# Patient Record
Sex: Female | Born: 1980 | Race: White | Hispanic: No | Marital: Married | Smoking: Former smoker
Health system: Southern US, Community
[De-identification: ages and names within clinical notes are randomized; demographics above are authoritative.]

## PROBLEM LIST (undated history)

## (undated) ENCOUNTER — Inpatient Hospital Stay (HOSPITAL_COMMUNITY): Payer: Self-pay

## (undated) DIAGNOSIS — Z8632 Personal history of gestational diabetes: Secondary | ICD-10-CM

## (undated) DIAGNOSIS — O1490 Unspecified pre-eclampsia, unspecified trimester: Secondary | ICD-10-CM

## (undated) DIAGNOSIS — R5600 Simple febrile convulsions: Secondary | ICD-10-CM

## (undated) DIAGNOSIS — E039 Hypothyroidism, unspecified: Secondary | ICD-10-CM

## (undated) DIAGNOSIS — O24419 Gestational diabetes mellitus in pregnancy, unspecified control: Secondary | ICD-10-CM

## (undated) DIAGNOSIS — R894 Abnormal immunological findings in specimens from other organs, systems and tissues: Secondary | ICD-10-CM

## (undated) DIAGNOSIS — O139 Gestational [pregnancy-induced] hypertension without significant proteinuria, unspecified trimester: Secondary | ICD-10-CM

## (undated) DIAGNOSIS — N920 Excessive and frequent menstruation with regular cycle: Secondary | ICD-10-CM

## (undated) DIAGNOSIS — G4730 Sleep apnea, unspecified: Secondary | ICD-10-CM

## (undated) DIAGNOSIS — A749 Chlamydial infection, unspecified: Secondary | ICD-10-CM

## (undated) DIAGNOSIS — I10 Essential (primary) hypertension: Secondary | ICD-10-CM

## (undated) DIAGNOSIS — A6000 Herpesviral infection of urogenital system, unspecified: Secondary | ICD-10-CM

## (undated) HISTORY — PX: TYMPANOSTOMY TUBE PLACEMENT: SHX32

## (undated) HISTORY — PX: BREAST SURGERY: SHX581

## (undated) HISTORY — PX: BREAST REDUCTION SURGERY: SHX8

---
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
## 2010-01-18 ENCOUNTER — Emergency Department (HOSPITAL_COMMUNITY): Admission: EM | Admit: 2010-01-18 | Discharge: 2010-01-18 | Payer: Self-pay | Admitting: Emergency Medicine

## 2010-02-07 ENCOUNTER — Emergency Department (HOSPITAL_COMMUNITY)
Admission: EM | Admit: 2010-02-07 | Discharge: 2010-02-07 | Payer: Self-pay | Source: Home / Self Care | Admitting: Emergency Medicine

## 2010-02-27 LAB — RPR: RPR: NONREACTIVE

## 2010-02-27 LAB — HEPATITIS B SURFACE ANTIGEN: Hepatitis B Surface Ag: NEGATIVE

## 2010-03-02 DIAGNOSIS — O139 Gestational [pregnancy-induced] hypertension without significant proteinuria, unspecified trimester: Secondary | ICD-10-CM

## 2010-03-02 HISTORY — DX: Gestational (pregnancy-induced) hypertension without significant proteinuria, unspecified trimester: O13.9

## 2010-09-06 ENCOUNTER — Inpatient Hospital Stay (HOSPITAL_COMMUNITY)
Admission: AD | Admit: 2010-09-06 | Discharge: 2010-09-07 | Disposition: A | Discharge status: Home / Self Care | Payer: BC Managed Care – PPO | Source: Ambulatory Visit | Attending: Obstetrics and Gynecology | Admitting: Obstetrics and Gynecology

## 2010-09-06 ENCOUNTER — Encounter (HOSPITAL_COMMUNITY): Payer: Self-pay | Admitting: *Deleted

## 2010-09-06 ENCOUNTER — Inpatient Hospital Stay (HOSPITAL_COMMUNITY)
Admission: AD | Admit: 2010-09-06 | Payer: BC Managed Care – PPO | Source: Ambulatory Visit | Admitting: Obstetrics and Gynecology

## 2010-09-06 DIAGNOSIS — O4700 False labor before 37 completed weeks of gestation, unspecified trimester: Secondary | ICD-10-CM | POA: Insufficient documentation

## 2010-09-06 HISTORY — DX: Herpesviral infection of urogenital system, unspecified: A60.00

## 2010-09-06 HISTORY — DX: Chlamydial infection, unspecified: A74.9

## 2010-09-06 HISTORY — DX: Hypothyroidism, unspecified: E03.9

## 2010-09-06 LAB — URINALYSIS, ROUTINE W REFLEX MICROSCOPIC
Leukocytes, UA: NEGATIVE
Protein, ur: >300 mg/dL — AB
Urobilinogen, UA: 0.2 mg/dL (ref 0.0–1.0)

## 2010-09-06 LAB — URINE MICROSCOPIC-ADD ON

## 2010-09-06 NOTE — Initial Assessments (Signed)
Patient is here with c/o contraction every 2-3mins since 2am. She denies any vaginal bleeding or lof. She does c/o yellow, thick non-odorous vaginal discharge. She report good fetal movement.

## 2010-09-06 NOTE — Progress Notes (Signed)
Pt states, " I've had contractions all day and they are 2-3 min apart."

## 2010-09-07 LAB — CBC
HCT: 35.4 % — ABNORMAL LOW (ref 36.0–46.0)
MCV: 84.9 fL (ref 78.0–100.0)
RDW: 14.8 % (ref 11.5–15.5)
WBC: 14.2 10*3/uL — ABNORMAL HIGH (ref 4.0–10.5)

## 2010-09-07 LAB — COMPREHENSIVE METABOLIC PANEL
Albumin: 2.3 g/dL — ABNORMAL LOW (ref 3.5–5.2)
BUN: 12 mg/dL (ref 6–23)
Chloride: 110 mEq/L (ref 96–112)
Creatinine, Ser: 0.65 mg/dL (ref 0.50–1.10)
GFR calc Af Amer: >60 mL/min (ref >60)
GFR calc non Af Amer: >60 mL/min (ref >60)
Glucose, Bld: 87 mg/dL (ref 70–99)
Total Bilirubin: 0.2 mg/dL — ABNORMAL LOW (ref 0.3–1.2)

## 2010-09-07 LAB — URIC ACID: Uric Acid, Serum: 7.6 mg/dL — ABNORMAL HIGH (ref 2.4–7.0)

## 2010-09-07 LAB — LACTATE DEHYDROGENASE: LDH: 251 U/L — ABNORMAL HIGH (ref 94–250)

## 2010-09-07 NOTE — Plan of Care (Signed)
Dr Senaida Ores returned call and she is notified of bp reading, lab results. Order to proceed with discharge plan. Have patient obtain and return the 24 hour urine.

## 2010-09-07 NOTE — Plan of Care (Signed)
Dr Senaida Ores returned page. She is notifed of patient bp reading, ua results. Order to obtain cbc, cmet, uric acid, ldh. She states that is labs are normal. Discharge patient home with 24 hour urine collection for her to return to the office on Monday. Give labor precautions, pregnancy-induced hypertension instructions.

## 2010-09-08 ENCOUNTER — Encounter (HOSPITAL_COMMUNITY): Payer: Self-pay | Admitting: Obstetrics and Gynecology

## 2010-09-08 ENCOUNTER — Encounter: Payer: Self-pay | Admitting: Obstetrics and Gynecology

## 2010-09-08 ENCOUNTER — Other Ambulatory Visit: Payer: Self-pay | Admitting: Obstetrics and Gynecology

## 2010-09-08 ENCOUNTER — Encounter (HOSPITAL_COMMUNITY): Payer: Self-pay | Admitting: *Deleted

## 2010-09-08 ENCOUNTER — Inpatient Hospital Stay (HOSPITAL_COMMUNITY)
Admission: AD | Admit: 2010-09-08 | Discharge: 2010-09-15 | DRG: 372 | Disposition: A | Discharge status: Home / Self Care | Payer: BC Managed Care – PPO | Source: Ambulatory Visit | Attending: Obstetrics and Gynecology | Admitting: Obstetrics and Gynecology

## 2010-09-08 DIAGNOSIS — O99892 Other specified diseases and conditions complicating childbirth: Secondary | ICD-10-CM | POA: Diagnosis present

## 2010-09-08 DIAGNOSIS — O1400 Mild to moderate pre-eclampsia, unspecified trimester: Secondary | ICD-10-CM | POA: Insufficient documentation

## 2010-09-08 DIAGNOSIS — E079 Disorder of thyroid, unspecified: Secondary | ICD-10-CM | POA: Diagnosis present

## 2010-09-08 DIAGNOSIS — O403XX0 Polyhydramnios, third trimester, not applicable or unspecified: Secondary | ICD-10-CM | POA: Insufficient documentation

## 2010-09-08 DIAGNOSIS — Z3490 Encounter for supervision of normal pregnancy, unspecified, unspecified trimester: Secondary | ICD-10-CM

## 2010-09-08 DIAGNOSIS — Z22330 Carrier of Group B streptococcus: Secondary | ICD-10-CM

## 2010-09-08 DIAGNOSIS — E039 Hypothyroidism, unspecified: Secondary | ICD-10-CM | POA: Diagnosis present

## 2010-09-08 DIAGNOSIS — O99284 Endocrine, nutritional and metabolic diseases complicating childbirth: Secondary | ICD-10-CM | POA: Diagnosis present

## 2010-09-08 DIAGNOSIS — IMO0002 Reserved for concepts with insufficient information to code with codable children: Principal | ICD-10-CM | POA: Diagnosis present

## 2010-09-08 DIAGNOSIS — O409XX0 Polyhydramnios, unspecified trimester, not applicable or unspecified: Secondary | ICD-10-CM | POA: Diagnosis present

## 2010-09-08 HISTORY — DX: Gestational (pregnancy-induced) hypertension without significant proteinuria, unspecified trimester: O13.9

## 2010-09-08 LAB — COMPREHENSIVE METABOLIC PANEL
ALT: 18 U/L (ref 0–35)
Alkaline Phosphatase: 105 U/L (ref 39–117)
Chloride: 104 mEq/L (ref 96–112)
GFR calc Af Amer: >60 mL/min (ref >60)
Glucose, Bld: 128 mg/dL — ABNORMAL HIGH (ref 70–99)
Potassium: 3.7 mEq/L (ref 3.5–5.1)
Sodium: 137 mEq/L (ref 135–145)
Total Bilirubin: 0.1 mg/dL — ABNORMAL LOW (ref 0.3–1.2)
Total Protein: 6.1 g/dL (ref 6.0–8.3)

## 2010-09-08 LAB — CBC
Hemoglobin: 11.8 g/dL — ABNORMAL LOW (ref 12.0–15.0)
MCHC: 33.4 g/dL (ref 30.0–36.0)
RBC: 4.14 MIL/uL (ref 3.87–5.11)
WBC: 13.6 10*3/uL — ABNORMAL HIGH (ref 4.0–10.5)

## 2010-09-08 MED ORDER — ZOLPIDEM TARTRATE 10 MG PO TABS
10.0000 mg | ORAL_TABLET | Freq: Every evening | ORAL | Status: DC | PRN
  Administered 2010-09-11: 10 mg via ORAL
  Filled 2010-09-08: qty 1

## 2010-09-08 MED ORDER — DIPHENHYDRAMINE HCL 25 MG PO CAPS
25.0000 mg | ORAL_CAPSULE | Freq: Four times a day (QID) | ORAL | Status: DC | PRN
  Administered 2010-09-08: 25 mg via ORAL
  Filled 2010-09-08: qty 1

## 2010-09-08 MED ORDER — FLUTICASONE PROPIONATE HFA 44 MCG/ACT IN AERO
1.0000 | INHALATION_SPRAY | Freq: Every day | RESPIRATORY_TRACT | Status: DC
  Administered 2010-09-09 – 2010-09-11 (×3): 1 via RESPIRATORY_TRACT
  Filled 2010-09-08 (×5): qty 1

## 2010-09-08 MED ORDER — LEVOTHYROXINE SODIUM 50 MCG PO TABS
50.0000 ug | ORAL_TABLET | Freq: Every day | ORAL | Status: DC
  Administered 2010-09-09 – 2010-09-12 (×4): 50 ug via ORAL
  Filled 2010-09-08 (×5): qty 1

## 2010-09-08 MED ORDER — LORATADINE 10 MG PO TABS
10.0000 mg | ORAL_TABLET | Freq: Every day | ORAL | Status: DC
  Administered 2010-09-09 – 2010-09-11 (×3): 10 mg via ORAL
  Filled 2010-09-08 (×5): qty 1

## 2010-09-08 MED ORDER — ALBUTEROL SULFATE HFA 108 (90 BASE) MCG/ACT IN AERS
2.0000 | INHALATION_SPRAY | Freq: Four times a day (QID) | RESPIRATORY_TRACT | Status: DC | PRN
  Filled 2010-09-08: qty 6.7

## 2010-09-08 MED ORDER — VALACYCLOVIR HCL 500 MG PO TABS
500.0000 mg | ORAL_TABLET | Freq: Every day | ORAL | Status: DC
  Filled 2010-09-08: qty 1

## 2010-09-08 MED ORDER — PRENATAL PLUS 27-1 MG PO TABS
1.0000 | ORAL_TABLET | Freq: Every day | ORAL | Status: DC
  Administered 2010-09-09 – 2010-09-11 (×3): 1 via ORAL
  Filled 2010-09-08 (×3): qty 1

## 2010-09-08 MED ORDER — DOCUSATE SODIUM 100 MG PO CAPS
100.0000 mg | ORAL_CAPSULE | Freq: Every day | ORAL | Status: DC
  Administered 2010-09-11: 100 mg via ORAL
  Filled 2010-09-08: qty 1

## 2010-09-08 MED ORDER — FLUTICASONE PROPIONATE HFA 44 MCG/ACT IN AERO
1.0000 | INHALATION_SPRAY | Freq: Two times a day (BID) | RESPIRATORY_TRACT | Status: DC
  Filled 2010-09-08: qty 1

## 2010-09-08 MED ORDER — CALCIUM CARBONATE ANTACID 500 MG PO CHEW
2.0000 | CHEWABLE_TABLET | ORAL | Status: DC | PRN
  Administered 2010-09-09: 400 mg via ORAL
  Filled 2010-09-08: qty 2

## 2010-09-08 MED ORDER — ACETAMINOPHEN 325 MG PO TABS
650.0000 mg | ORAL_TABLET | ORAL | Status: DC | PRN
  Administered 2010-09-09 – 2010-09-11 (×3): 650 mg via ORAL
  Filled 2010-09-08 (×3): qty 2

## 2010-09-08 MED ORDER — ALBUTEROL SULFATE HFA 108 (90 BASE) MCG/ACT IN AERS
2.0000 | INHALATION_SPRAY | Freq: Four times a day (QID) | RESPIRATORY_TRACT | Status: DC
  Filled 2010-09-08: qty 6.7

## 2010-09-08 MED ORDER — MONTELUKAST SODIUM 10 MG PO TABS
10.0000 mg | ORAL_TABLET | Freq: Every day | ORAL | Status: DC | PRN
  Filled 2010-09-08: qty 1

## 2010-09-08 NOTE — Progress Notes (Signed)
OB History and Physical  30yo G1 at 36+ with PreEclampsia.  Pt turned in 24hr urine today with 1.9gm protein.  Pt with HA, no blurry vision or RUQ pain.  +FM, no LOF, no VB, occ ctx.  PMH Asthma Hypothyroid Pneumonia  PSH Breast Reduction, Br lumpectomy  POBGYN Hx G1 present, preg with PreE HSV Chlamydia  Meds Valtrex Prenatal Vitamin Albuterol  All Sulfa Antibiotic, No latex allergy  SH denies alcohol, tobacco, or drug use; married  FH anemia, COPD, depression, Diabetes, fibromyalgia, HTN, Thyroid dysfunction  PNL: A-, Ab Scr neg, Hgb 14.0, Pap WNL, RI, HepBsAg neg, HIV neg, Ur Cx neg, RPR NR, Plt 254K, TSH WNL checked in each trimester, HSV 2 +, GC -, Chl -, First trimester screen WNL, CF neg, glucola 135, 3hr - 71/157/128/112, GBBS +  [place] 9wk, EDC 8/2 cwd; 18wk cwd, nl anat, ant plac, female Rhogam 07/11/10  PE AF BP 140-150/ 85-98 gen NAD CV RRR Lungs CTAB Abd soft, FNT Ex sym, NT  FHTs 155  A/P 30yo G1 at 36+ with PreE 1. Admit 2. Monitor closely 3. Daily labs 4. Deliver around [employer]

## 2010-09-08 NOTE — H&P (Signed)
Melissa Mckay is an 30 y.o. female. At 36+ weeks with PreEclampsia by 24 hr urine with 1.9gm protein. BP elevated at 140/85-98.  Pertinent Gynecological History  Last pap: normal  OB History: G1 P0, pregnancy complicated by polyhydramnios and PreEclampsia  Menstrual History:  LMP 12/26/2009, dates pregnancy, c/w US  Past Medical History   Diagnosis  Date   .  Asthma    .  Hypothyroidism    .  Chlamydia    .  Genital HSV     Past Surgical History   Procedure  Date   .  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  Cancer  Mother    .  Anemia     .  COPD     .  Depression     .  Diabetes type II     .  Fibromyalgia     .  Hypertension     .  Thyroid disease     Social History: reports that she has never smoked. She does not have any smokeless tobacco history on file. She reports that she does not drink alcohol or use illicit drugs.  Allergies:  Allergies   Allergen  Reactions   .  Sulfa Antibiotics  Hives and Shortness Of Breath   (Not in a hospital admission)  Review of Systems  Constitutional: Negative.  Eyes: Negative.  Respiratory: Negative.  Cardiovascular: Negative.  Gastrointestinal: Negative.  Genitourinary: Negative.  Musculoskeletal: Negative.  Skin: Negative.  Neurological: Positive for headaches.  Endo/Heme/Allergies: Negative.  Psychiatric/Behavioral: Negative.  There were no vitals taken for this visit.  Physical Exam  @RESULTS24@  @RISRSLT48@  Assessment/Plan: 30yo G1P0 at 36+ with polyhtydramnios and PreEclampsia admit to closely monitor, daily labs, daily NST,  [name],Fatma Rutten  09/08/2010, 5:14 PM  

## 2010-09-09 LAB — COMPREHENSIVE METABOLIC PANEL
CO2: 24 mEq/L (ref 19–32)
Calcium: 9.2 mg/dL (ref 8.4–10.5)
Creatinine, Ser: 0.61 mg/dL (ref 0.50–1.10)
GFR calc Af Amer: >60 mL/min (ref >60)
GFR calc non Af Amer: >60 mL/min (ref >60)
Glucose, Bld: 86 mg/dL (ref 70–99)

## 2010-09-09 LAB — CBC
Hemoglobin: 11.7 g/dL — ABNORMAL LOW (ref 12.0–15.0)
MCH: 28.5 pg (ref 26.0–34.0)
MCV: 84.9 fL (ref 78.0–100.0)
Platelets: 131 10*3/uL — ABNORMAL LOW (ref 150–400)
RBC: 4.11 MIL/uL (ref 3.87–5.11)

## 2010-09-09 MED ORDER — VALACYCLOVIR HCL 500 MG PO TABS
500.0000 mg | ORAL_TABLET | Freq: Every day | ORAL | Status: DC
  Administered 2010-09-09 – 2010-09-11 (×3): 500 mg via ORAL
  Filled 2010-09-09 (×3): qty 1

## 2010-09-09 NOTE — H&P (Signed)
Melissa Mckay is an 30 y.o. female. At 36+ weeks with PreEclampsia by 24 hr urine with 1.9gm protein. BP elevated at 140/85-98.  Pertinent Gynecological History  Last pap: normal  OB History: G1 P0, pregnancy complicated by polyhydramnios and PreEclampsia  Menstrual History:  LMP 12/26/2009, dates pregnancy, c/w [place]  Past Medical History   Diagnosis  Date   .  Asthma    .  Hypothyroidism    .  Chlamydia    .  Genital HSV     Past Surgical History   Procedure  Date   .  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  Cancer  Mother    .  Anemia     .  COPD     .  Depression     .  Diabetes type II     .  Fibromyalgia     .  Hypertension     .  Thyroid disease     Social History: reports that she has never smoked. She does not have any smokeless tobacco history on file. She reports that she does not drink alcohol or use illicit drugs.  Allergies:  Allergies   Allergen  Reactions   .  Sulfa Antibiotics  Hives and Shortness Of Breath   (Not in a hospital admission)  Review of Systems  Constitutional: Negative.  Eyes: Negative.  Respiratory: Negative.  Cardiovascular: Negative.  Gastrointestinal: Negative.  Genitourinary: Negative.  Musculoskeletal: Negative.  Skin: Negative.  Neurological: Positive for headaches.  Endo/Heme/Allergies: Negative.  Psychiatric/Behavioral: Negative.  There were no vitals taken for this visit.  Physical Exam  @RESULTS24 @  @RISRSLT48 @  Assessment/Plan: 30yo G1P0 at 36+ with polyhtydramnios and PreEclampsia admit to closely monitor, daily labs, daily NST,  Melissa Mckay  09/08/2010, 5:14 PM

## 2010-09-09 NOTE — Progress Notes (Signed)
09/09/10  No c/o's, +FM, no LOF, no VB, occ ctx, HA improved with Tylenol.  Feeling better  AF VSS 140-155/71-90; good uop gen NAD Abd soft, FNT  FHTs 140's R toco occ  30yo G1 A 36+ with PreE, close monitoring, plan for iol after 37 weeks.

## 2010-09-10 ENCOUNTER — Encounter (HOSPITAL_COMMUNITY): Payer: Self-pay | Admitting: Obstetrics and Gynecology

## 2010-09-10 DIAGNOSIS — Z3490 Encounter for supervision of normal pregnancy, unspecified, unspecified trimester: Secondary | ICD-10-CM

## 2010-09-10 LAB — COMPREHENSIVE METABOLIC PANEL
ALT: 15 U/L (ref 0–35)
Alkaline Phosphatase: 111 U/L (ref 39–117)
BUN: 11 mg/dL (ref 6–23)
Chloride: 102 mEq/L (ref 96–112)
GFR calc Af Amer: >60 mL/min (ref >60)
Glucose, Bld: 79 mg/dL (ref 70–99)
Potassium: 4.2 mEq/L (ref 3.5–5.1)
Sodium: 134 mEq/L — ABNORMAL LOW (ref 135–145)
Total Bilirubin: 0.2 mg/dL — ABNORMAL LOW (ref 0.3–1.2)
Total Protein: 6 g/dL (ref 6.0–8.3)

## 2010-09-10 LAB — CBC
HCT: 37.6 % (ref 36.0–46.0)
Hemoglobin: 12.3 g/dL (ref 12.0–15.0)
MCHC: 32.7 g/dL (ref 30.0–36.0)
RBC: 4.39 MIL/uL (ref 3.87–5.11)
WBC: 12.4 10*3/uL — ABNORMAL HIGH (ref 4.0–10.5)

## 2010-09-10 NOTE — Progress Notes (Signed)
UR chart review completed.  

## 2010-09-10 NOTE — Progress Notes (Signed)
  No c/o's, head feeling better.  +FM, no LOF, no VB, occ ctx  AF VSS (130-145/80-90's) gen NAD Abd soft FNT FHTs 130's R toco irr q Cervix unchanged per RN Labs stable plt 142, AST 18, ALT 15  G1 at 36+6, for iol after 37weeks; with preE, labs stable, NST tid JBOVARD MD

## 2010-09-11 ENCOUNTER — Encounter (HOSPITAL_COMMUNITY): Payer: Self-pay | Admitting: Obstetrics and Gynecology

## 2010-09-11 ENCOUNTER — Encounter (HOSPITAL_COMMUNITY): Payer: Self-pay | Admitting: Anesthesiology

## 2010-09-11 LAB — COMPREHENSIVE METABOLIC PANEL
ALT: 14 U/L (ref 0–35)
ALT: 13 U/L (ref 0–35)
AST: 15 U/L (ref 0–37)
AST: 16 U/L (ref 0–37)
Albumin: 2.1 g/dL — ABNORMAL LOW (ref 3.5–5.2)
Alkaline Phosphatase: 108 U/L (ref 39–117)
CO2: 22 mEq/L (ref 19–32)
Calcium: 8.6 mg/dL (ref 8.4–10.5)
Calcium: 8.4 mg/dL (ref 8.4–10.5)
GFR calc Af Amer: >60 mL/min (ref >60)
GFR calc non Af Amer: >60 mL/min (ref >60)
Potassium: 3.9 mEq/L (ref 3.5–5.1)
Sodium: 136 mEq/L (ref 135–145)
Sodium: 135 mEq/L (ref 135–145)
Total Protein: 6.4 g/dL (ref 6.0–8.3)
Total Protein: 6.1 g/dL (ref 6.0–8.3)

## 2010-09-11 LAB — CBC
HCT: 36.1 % (ref 36.0–46.0)
Hemoglobin: 12 g/dL (ref 12.0–15.0)
Hemoglobin: 12.1 g/dL (ref 12.0–15.0)
MCH: 27.9 pg (ref 26.0–34.0)
MCH: 28.6 pg (ref 26.0–34.0)
MCHC: 32.7 g/dL (ref 30.0–36.0)
MCHC: 33.5 g/dL (ref 30.0–36.0)
Platelets: 138 10*3/uL — ABNORMAL LOW (ref 150–400)
RDW: 14.9 % (ref 11.5–15.5)

## 2010-09-11 MED ORDER — LACTATED RINGERS IV SOLN
INTRAVENOUS | Status: DC
  Administered 2010-09-11: 22:00:00 via INTRAVENOUS

## 2010-09-11 MED ORDER — ONDANSETRON HCL 4 MG/2ML IJ SOLN
4.0000 mg | Freq: Four times a day (QID) | INTRAMUSCULAR | Status: DC | PRN
  Administered 2010-09-12: 4 mg via INTRAVENOUS
  Filled 2010-09-11: qty 2

## 2010-09-11 MED ORDER — CITRIC ACID-SODIUM CITRATE 334-500 MG/5ML PO SOLN: 30.0000 mL | ORAL | Status: DC | PRN

## 2010-09-11 MED ORDER — OXYTOCIN 20 UNITS IN LACTATED RINGERS INFUSION - SIMPLE
125.0000 mL/h | Freq: Once | INTRAVENOUS | Status: DC
  Filled 2010-09-11: qty 1000

## 2010-09-11 MED ORDER — FLEET ENEMA 7-19 GM/118ML RE ENEM: 1.0000 | ENEMA | RECTAL | Status: DC | PRN

## 2010-09-11 MED ORDER — PENICILLIN G POTASSIUM 5000000 UNITS IJ SOLR
5.0000 10*6.[IU] | Freq: Once | INTRAVENOUS | Status: AC
  Administered 2010-09-12: 5 10*6.[IU] via INTRAVENOUS
  Filled 2010-09-11: qty 5

## 2010-09-11 MED ORDER — NALBUPHINE SYRINGE 5 MG/0.5 ML
10.0000 mg | INJECTION | INTRAMUSCULAR | Status: DC | PRN
  Filled 2010-09-11: qty 1

## 2010-09-11 MED ORDER — IBUPROFEN 600 MG PO TABS
600.0000 mg | ORAL_TABLET | Freq: Four times a day (QID) | ORAL | Status: DC | PRN
  Filled 2010-09-11: qty 1

## 2010-09-11 MED ORDER — OXYCODONE-ACETAMINOPHEN 5-325 MG PO TABS: 2.0000 | ORAL_TABLET | ORAL | Status: DC | PRN

## 2010-09-11 MED ORDER — LIDOCAINE HCL (PF) 1 % IJ SOLN
30.0000 mL | Freq: Once | INTRAMUSCULAR | Status: AC | PRN
  Filled 2010-09-11: qty 30

## 2010-09-11 MED ORDER — LACTATED RINGERS IV SOLN
500.0000 mL | INTRAVENOUS | Status: AC | PRN
  Administered 2010-09-12: 1000 mL via INTRAVENOUS

## 2010-09-11 MED ORDER — ACETAMINOPHEN 325 MG PO TABS
650.0000 mg | ORAL_TABLET | ORAL | Status: DC | PRN
  Filled 2010-09-11: qty 2

## 2010-09-11 MED ORDER — PENICILLIN G POTASSIUM 5000000 UNITS IJ SOLR
2.5000 10*6.[IU] | INTRAVENOUS | Status: DC
  Administered 2010-09-12 (×4): 2.5 10*6.[IU] via INTRAVENOUS
  Filled 2010-09-11 (×13): qty 2.5

## 2010-09-11 MED ORDER — DINOPROSTONE 10 MG VA INST
10.0000 mg | VAGINAL_INSERT | Freq: Once | VAGINAL | Status: AC
  Administered 2010-09-11: 10 mg via VAGINAL
  Filled 2010-09-11: qty 1

## 2010-09-11 NOTE — Anesthesia Preprocedure Evaluation (Deleted)
Anesthesia Evaluation  Name, MR# and DOB Patient awake  General Assessment Comment  Reviewed: Allergy & Precautions, H&P  and Patient's Chart, lab work & pertinent test results  History of Anesthesia Complications Negative for: history of anesthetic complications  Airway Mallampati: I TM Distance: >3 FB Neck ROM: Full    Dental  (+) Teeth Intact   Pulmonary  asthma, Inhaler asthma and Steroids  clear to auscultation    Cardiovascular hypertension (preeclampsia), Regular Normal   Neuro/PsychNegative Neurological ROS Negative Psych ROS  GI/Hepatic/Renal negative GI ROS, negative Liver ROS, and negative Renal ROS (+)       Endo/Other   (+)  Hypothyroidism, Morbid obesity Abdominal   Musculoskeletal negative musculoskeletal ROS (+)  Hematology negative hematology ROS (+)   Peds  Reproductive/Obstetrics (+) Pregnancy   Anesthesia Other Findings             Anesthesia Physical Anesthesia Plan  ASA: III  Anesthesia Plan: Epidural   Post-op Pain Management:    Induction:   Airway Management Planned:   Additional Equipment:   Intra-op Plan:   Post-operative Plan:   Informed Consent: I have reviewed the patients History and Physical, chart, labs and discussed the procedure including the risks, benefits and alternatives for the proposed anesthesia with the patient or authorized representative who has indicated his/her understanding and acceptance.     Plan Discussed with:   Anesthesia Plan Comments: (Asked by Dr. Ellyn Hack to consult for anesthesia for eventual delivery.  Patient is admitted with preeclampsia at 37 weeks, plan for induction in near future.  Anesthesia Pre-Eval completed.  Anesthesia options, R/B/A discussed, questions answered.  Jasmine December, MD)        Anesthesia Quick Evaluation

## 2010-09-11 NOTE — Initial Assessments (Signed)
Phone call received from Dr. Ellyn Hack that pt will be moved to L&D tonight around 1900 for induction of labor.

## 2010-09-11 NOTE — Plan of Care (Signed)
Problem: Consults Goal: Birthing Suites Patient Information Press F2 to bring up selections list  Outcome: Completed/Met Date Met:  09/11/10  Pt 37-[redacted] weeks EGA     

## 2010-09-11 NOTE — Progress Notes (Signed)
HD # 3 Subjective: no complaints and +FM, no LOF, no VB, occ ctx.  Objective: Blood pressure 147/89, pulse 90, temperature 98.4 F (36.9 C), temperature source Oral, resp. rate 22, height 5\' 4"  (1.626 m), weight 118.389 kg (261 lb), SpO2 99.00%.  Physical Exam:  General: alert and no distress FHT 130's R toco occ ctx Basename 09/11/10 0533 09/10/10 0534  HGB 12.0 12.3  HCT 36.7 37.6   Plts 138, nl LFTs  Assessment/Plan: 37 wk IUP with PreE, plan for delivery soon will figure out delivery plan today, poss d/c this pm.   LOS: 3 days   Melissa,Aishi Mckay 09/11/2010, 7:55 AM

## 2010-09-11 NOTE — Consults (Signed)
Asked by Dr. Ellyn Hack to consult for anesthesia for eventual delivery.  Patient is admitted with preeclampsia at 37 weeks, plan for induction in near future.  Anesthesia Pre-Eval completed.  Anesthesia options, R/B/A discussed, questions answered.  Jasmine December, MD

## 2010-09-11 NOTE — Progress Notes (Signed)
  09/11/10 Discussion with MFM, Dr. Sherrie George recc IOL since 37weeks with PreE.  Will start induction tonight with cervidil.  D/w pt.  In am will decide if cervidil again or pitocin.  Pt GBBS + , will start PCN when >4cm. J [name]

## 2010-09-12 ENCOUNTER — Inpatient Hospital Stay (HOSPITAL_COMMUNITY): Payer: BC Managed Care – PPO | Admitting: Anesthesiology

## 2010-09-12 ENCOUNTER — Encounter (HOSPITAL_COMMUNITY): Payer: Self-pay | Admitting: Anesthesiology

## 2010-09-12 ENCOUNTER — Encounter (HOSPITAL_COMMUNITY): Payer: Self-pay | Admitting: *Deleted

## 2010-09-12 LAB — CBC
MCH: 28.4 pg (ref 26.0–34.0)
MCV: 85.8 fL (ref 78.0–100.0)
Platelets: 147 10*3/uL — ABNORMAL LOW (ref 150–400)
RDW: 14.8 % (ref 11.5–15.5)

## 2010-09-12 LAB — COMPREHENSIVE METABOLIC PANEL
AST: 16 U/L (ref 0–37)
Albumin: 2.2 g/dL — ABNORMAL LOW (ref 3.5–5.2)
Calcium: 8.8 mg/dL (ref 8.4–10.5)
Creatinine, Ser: 0.72 mg/dL (ref 0.50–1.10)
GFR calc non Af Amer: >60 mL/min (ref >60)

## 2010-09-12 LAB — RPR: RPR Ser Ql: NONREACTIVE

## 2010-09-12 MED ORDER — MAGNESIUM SULFATE 40 G IN LACTATED RINGERS - SIMPLE
2.0000 g/h | INTRAVENOUS | Status: DC
  Administered 2010-09-12 – 2010-09-13 (×2): 2 g/h via INTRAVENOUS
  Filled 2010-09-12 (×3): qty 500

## 2010-09-12 MED ORDER — EPHEDRINE 5 MG/ML INJ
10.0000 mg | INTRAVENOUS | Status: DC | PRN
  Filled 2010-09-12 (×3): qty 4

## 2010-09-12 MED ORDER — FENTANYL 2.5 MCG/ML BUPIVACAINE 1/10 % EPIDURAL INFUSION (WH - ANES)
2.0000 mL/h | INTRAMUSCULAR | Status: DC
  Administered 2010-09-12 (×2): 14 mL/h via EPIDURAL
  Filled 2010-09-12 (×5): qty 60

## 2010-09-12 MED ORDER — EPHEDRINE 5 MG/ML INJ
10.0000 mg | INTRAVENOUS | Status: DC | PRN
  Filled 2010-09-12: qty 4

## 2010-09-12 MED ORDER — PHENYLEPHRINE 40 MCG/ML (10ML) SYRINGE FOR IV PUSH (FOR BLOOD PRESSURE SUPPORT)
80.0000 ug | PREFILLED_SYRINGE | INTRAVENOUS | Status: DC | PRN
  Filled 2010-09-12: qty 5

## 2010-09-12 MED ORDER — OXYTOCIN 20 UNITS IN LACTATED RINGERS INFUSION - SIMPLE
1.0000 m[IU]/min | INTRAVENOUS | Status: DC
  Administered 2010-09-12: 2 m[IU]/min via INTRAVENOUS
  Filled 2010-09-12: qty 1000

## 2010-09-12 MED ORDER — LACTATED RINGERS IV SOLN
500.0000 mL | Freq: Once | INTRAVENOUS | Status: AC
  Administered 2010-09-12: 1000 mL via INTRAVENOUS

## 2010-09-12 MED ORDER — LIDOCAINE HCL 1.5 % IJ SOLN
INTRAMUSCULAR | Status: DC | PRN
  Administered 2010-09-12 (×2): 4 mL via INTRADERMAL

## 2010-09-12 MED ORDER — DIPHENHYDRAMINE HCL 50 MG/ML IJ SOLN: 12.5000 mg | INTRAMUSCULAR | Status: DC | PRN

## 2010-09-12 MED ORDER — MAGNESIUM SULFATE BOLUS VIA INFUSION
4.0000 g | Freq: Once | INTRAVENOUS | Status: AC
  Administered 2010-09-12: 4 g via INTRAVENOUS
  Filled 2010-09-12: qty 500

## 2010-09-12 MED ORDER — FENTANYL 2.5 MCG/ML BUPIVACAINE 1/10 % EPIDURAL INFUSION (WH - ANES)
2.0000 mL/h | INTRAMUSCULAR | Status: DC
  Administered 2010-09-12 (×3): 14 mL/h via EPIDURAL
  Filled 2010-09-12: qty 60

## 2010-09-12 MED ORDER — PHENYLEPHRINE 40 MCG/ML (10ML) SYRINGE FOR IV PUSH (FOR BLOOD PRESSURE SUPPORT)
80.0000 ug | PREFILLED_SYRINGE | INTRAVENOUS | Status: DC | PRN
  Filled 2010-09-12 (×3): qty 5

## 2010-09-12 NOTE — Progress Notes (Signed)
Dr. Ambrose Mantle notified of pt status, SVE, FHR, minimal variability noted, RN interventions, oxygen running, and  UC pattern. Will continue to monitor.

## 2010-09-12 NOTE — Progress Notes (Signed)
4GM loading dose started

## 2010-09-12 NOTE — Anesthesia Procedure Notes (Addendum)
Epidural Patient location during procedure: OB Start time: 09/12/2010 8:12 AM  Staffing Anesthesiologist: Nasra Counce A. Performed by: anesthesiologist   Preanesthetic Checklist Completed: patient identified, site marked, surgical consent, pre-op evaluation, timeout performed, IV checked, risks and benefits discussed and monitors and equipment checked  Epidural Patient position: sitting Prep: site prepped and draped and DuraPrep Patient monitoring: continuous pulse ox and blood pressure Approach: midline Injection technique: LOR air  Needle:  Needle type: Tuohy  Needle gauge: 17 G Needle length: 9 cm Needle insertion depth: 5 cm cm Catheter type: closed end flexible Catheter size: 19 Gauge Catheter at skin depth: 10 cm Test dose: negative and 1.5% lidocaine  Assessment Events: blood not aspirated, injection not painful, no injection resistance, negative IV test and no paresthesia

## 2010-09-12 NOTE — Progress Notes (Signed)
Cervidil Removed Dr. Ellyn Hack.

## 2010-09-12 NOTE — Progress Notes (Signed)
Dr. Ellyn Hack and Dr. Ambrose Mantle notified of pt status, SVE, FHR, minimal variablilty noted, late decelerations noted, RN interventions, oxygen running, pt repositioned, UC pattern, and pitocin amount.  Orders received, will continue to monitor.

## 2010-09-12 NOTE — Progress Notes (Signed)
Dr. Ellyn Hack notified of pt status, SVE, FHR, UC pattern, and pitocin amount. Will continue to monitor.

## 2010-09-12 NOTE — Progress Notes (Signed)
Magnesium sulfate running at 2GM/hour

## 2010-09-12 NOTE — Progress Notes (Signed)
Dr. Ellyn Hack notified of pt status, FHR, UC pattern, epidural placement, and magnesium sulfate running. Will continue to monitor.

## 2010-09-12 NOTE — Progress Notes (Signed)
Dr. Ambrose Mantle notified of pt status, SVE, FHR, UC pattern, and pitocin restarted.  Will continue to monitor.

## 2010-09-12 NOTE — Progress Notes (Signed)
Pt has progressed to 9+ cm. She is on 16 mu/minite of pitocin and her contractions are q 2-4 minutes.FHR is normal.

## 2010-09-12 NOTE — Progress Notes (Signed)
Pt is comfortable with her epidural. Contractions are regular at 2-3 minute intervals. The cervix is 4 cm 80% and the vertex is at a -2 station. Bing Plume 09/12/2010

## 2010-09-12 NOTE — Progress Notes (Signed)
Melissa Mckay is a 30 y.o. G1P0000 at [redacted]w[redacted]d  admitted for induction of labor due to Pre-eclamptic toxemia of pregnancy..  Subjective: uncomf with ctx/pressure.  Doing well   Objective: BP 136/73  Pulse 90  Temp(Src) 98.2 F (36.8 C) (Oral)  Resp 16  Ht 5\' 4"  (1.626 m)  Wt 117.935 kg (260 lb)  BMI 44.63 kg/m2  SpO2 99%      FHT:  FHR: 140's bpm, variability: moderate,  accelerations:  Present,  decelerations:  Absent UC:   regular, every 2-6 minutes SVE:   Dilation 2.3 Effacement (%): 50 Station:-2 Exam by:: jbovard  Labs: Lab Results  Component Value Date   WBC 15.6* 09/12/2010   HGB 13.0 09/12/2010   HCT 39.3 09/12/2010   MCV 85.8 09/12/2010   PLT 147* 09/12/2010  AST and ALT stable  Assessment / Plan: Induction of labor due to PreE,  progressing well after cervidil, will start pitocin and PCN  Labor: Progressing normally, AROM 4 hr after PCN Preeclampsia:  no signs or symptoms of toxicity and labs stable Pain Control:  Epidural and Nubain, prn Anticipated MOD:  NSVD  [name],Melissa Mckay 09/12/2010, 7:14 AM

## 2010-09-12 NOTE — Progress Notes (Signed)
Dr. Ambrose Mantle notified of pt status, SVE, FHR, UC pattern, and pitocin amount. Will continue to monitor.

## 2010-09-12 NOTE — Progress Notes (Signed)
Received orders from Dr. Ambrose Mantle to D/C pitocin.

## 2010-09-12 NOTE — Progress Notes (Signed)
Dr. Ambrose Mantle at bedside and notified of pt status, SVE, FHR, UC pattern, and pitocin amount.  Will continue to monitor.

## 2010-09-12 NOTE — Progress Notes (Signed)
Huong Luthi is a 30 y.o. G1P0000 at [redacted]w[redacted]d by LMP admitted for preeclampsia  Subjective:   Objective: BP 131/62  Pulse 82  Temp(Src) 98.4 F (36.9 C) (Oral)  Resp 18  Ht 5\' 4"  (1.626 m)  Wt 117.935 kg (260 lb)  BMI 44.63 kg/m2  SpO2 99%   I/O this shift: In: 1460 [P.O.:960; I.V.:500] Out: 900 [Urine:900]  FHT:   UC:   irregular, every 3-5 minutes SVE:   Dilation: 4 Effacement (%): 60 Station: -2 Exam by:: Valentina Lucks, RN  Labs: Lab Results  Component Value Date   WBC 15.6* 09/12/2010   HGB 13.0 09/12/2010   HCT 39.3 09/12/2010   MCV 85.8 09/12/2010   PLT 147* 09/12/2010    Assessment / Plan: Induction of labor due to preeclampsia,  progressing well on pitocin   Roena Sassaman F 09/12/2010, 1:34 PM  This pt was on 2 mu/ minute of pitocin and had 2 or 3 late decelerations over 1 and 1/2 hours. The pitocin was d/c ed and now the FHR is fine. The cervix is 3 cm dilated and 50 % effaced and the vertex is at -3 station. Amniotomy produced clear fluid. Will reexam in 1 hour and if no progress will begin pitocin again.

## 2010-09-12 NOTE — Progress Notes (Signed)
Melissa Mckay is a 30 y.o. G1P0000 at [redacted]w[redacted]d by LMP admitted for preeclampsia  Subjective:Pt was on 2 mu/ minute of pitocin. She began having late decelerations x 3 over 1 and 1/2 hours.The pitocin was D/C ED nOW THE CERVIX IS 3 CM 50 % AND THE VERTEX IS AT -3 STATION. AROM produced clear fluid. Will observe for 1 hour and if no progress will restart pitocin.   Objective: BP 131/62  Pulse 82  Temp(Src) 98.4 F (36.9 C) (Oral)  Resp 18  Ht 5\' 4"  (1.626 m)  Wt 117.935 kg (260 lb)  BMI 44.63 kg/m2  SpO2 99%   I/O this shift: In: 1460 [P.O.:960; I.V.:500] Out: 900 [Urine:900]  FHT:  FHR: 145 bpm, variability: moderate,  accelerations:  Present,  decelerations:  Absent UC:   Contractions not printing out on the monitor. SVE:   Dilation: 3 Effacement (%): 50 Station: -3 Exam by:: [employer]: Lab Results  Component Value Date   WBC 15.6* 09/12/2010   HGB 13.0 09/12/2010   HCT 39.3 09/12/2010   MCV 85.8 09/12/2010   PLT 147* 09/12/2010    Assessment / Plan: Induction of labor due to preeclampsia,  progressing well on pitocin  Labor: slow progress Preeclampsia:  on magnesium sulfate Fetal Wellbeing:  Category I Pain Control:  Epidural I/D:  n/a Anticipated MOD:  NSVD  Melissa Mckay F 09/12/2010, 1:51 PM

## 2010-09-12 NOTE — Progress Notes (Signed)
Charting not save from [phone] due to anesthesia charting.  Super users fixed charting at [employer]. Pt's vitals during that time were: 0845 BP 133/78, pulse 99, FHR 145. 0855 BP 127/76, pulse 98, FHR 150. 0900 BP 137/76, pulse 99, FHR 150.

## 2010-09-13 ENCOUNTER — Other Ambulatory Visit: Payer: Self-pay | Admitting: Obstetrics and Gynecology

## 2010-09-13 ENCOUNTER — Encounter (HOSPITAL_COMMUNITY): Payer: Self-pay | Admitting: *Deleted

## 2010-09-13 LAB — CBC
HCT: 36.9 % (ref 36.0–46.0)
Hemoglobin: 12.6 g/dL (ref 12.0–15.0)
MCH: 28.5 pg (ref 26.0–34.0)
MCHC: 34.1 g/dL (ref 30.0–36.0)
MCHC: 33.4 g/dL (ref 30.0–36.0)
Platelets: 150 10*3/uL (ref 150–400)
RBC: 4.35 MIL/uL (ref 3.87–5.11)
RBC: 3.76 MIL/uL — ABNORMAL LOW (ref 3.87–5.11)
WBC: 18.3 10*3/uL — ABNORMAL HIGH (ref 4.0–10.5)

## 2010-09-13 LAB — DIFFERENTIAL
Basophils Absolute: 0 10*3/uL (ref 0.0–0.1)
Basophils Relative: 0 % (ref 0–1)
Eosinophils Absolute: 0 10*3/uL (ref 0.0–0.7)
Lymphs Abs: 1.4 10*3/uL (ref 0.7–4.0)
Neutrophils Relative %: 89 % — ABNORMAL HIGH (ref 43–77)

## 2010-09-13 LAB — ABO/RH: ABO/RH(D): A NEG

## 2010-09-13 LAB — URIC ACID: Uric Acid, Serum: 8.3 mg/dL — ABNORMAL HIGH (ref 2.4–7.0)

## 2010-09-13 LAB — COMPREHENSIVE METABOLIC PANEL
Albumin: 1.8 g/dL — ABNORMAL LOW (ref 3.5–5.2)
BUN: 11 mg/dL (ref 6–23)
Chloride: 95 mEq/L — ABNORMAL LOW (ref 96–112)
Creatinine, Ser: 0.81 mg/dL (ref 0.50–1.10)
Total Bilirubin: 0.2 mg/dL — ABNORMAL LOW (ref 0.3–1.2)

## 2010-09-13 LAB — LACTATE DEHYDROGENASE: LDH: 248 U/L (ref 94–250)

## 2010-09-13 MED ORDER — TETANUS-DIPHTH-ACELL PERTUSSIS 5-2.5-18.5 LF-MCG/0.5 IM SUSP
0.5000 mL | Freq: Once | INTRAMUSCULAR | Status: AC
  Administered 2010-09-14: 0.5 mL via INTRAMUSCULAR
  Filled 2010-09-13: qty 0.5

## 2010-09-13 MED ORDER — ANIMAL SHAPES WITH C & FA PO CHEW
2.0000 | CHEWABLE_TABLET | Freq: Every day | ORAL | Status: DC
  Administered 2010-09-13 – 2010-09-15 (×3): 2 via ORAL
  Filled 2010-09-13 (×3): qty 2

## 2010-09-13 MED ORDER — LORATADINE 10 MG PO TABS
10.0000 mg | ORAL_TABLET | Freq: Every day | ORAL | Status: DC
  Administered 2010-09-13 – 2010-09-15 (×3): 10 mg via ORAL
  Filled 2010-09-13 (×3): qty 1

## 2010-09-13 MED ORDER — LEVOTHYROXINE SODIUM 50 MCG PO TABS
50.0000 ug | ORAL_TABLET | Freq: Every day | ORAL | Status: DC
  Administered 2010-09-13 – 2010-09-15 (×3): 50 ug via ORAL
  Filled 2010-09-13 (×3): qty 1

## 2010-09-13 MED ORDER — RHO D IMMUNE GLOBULIN 1500 UNIT/2ML IJ SOLN
300.0000 ug | Freq: Once | INTRAMUSCULAR | Status: AC
  Administered 2010-09-13: 300 ug via INTRAVENOUS

## 2010-09-13 MED ORDER — IBUPROFEN 600 MG PO TABS
600.0000 mg | ORAL_TABLET | Freq: Four times a day (QID) | ORAL | Status: DC
  Administered 2010-09-13 – 2010-09-15 (×9): 600 mg via ORAL
  Filled 2010-09-13 (×9): qty 1

## 2010-09-13 MED ORDER — WITCH HAZEL-GLYCERIN EX PADS: MEDICATED_PAD | CUTANEOUS | Status: DC | PRN

## 2010-09-13 MED ORDER — ONDANSETRON HCL 4 MG PO TABS: 4.0000 mg | ORAL_TABLET | ORAL | Status: DC | PRN

## 2010-09-13 MED ORDER — SENNOSIDES-DOCUSATE SODIUM 8.6-50 MG PO TABS
1.0000 | ORAL_TABLET | Freq: Every day | ORAL | Status: DC
  Administered 2010-09-13: 1 via ORAL
  Administered 2010-09-14: 2 via ORAL

## 2010-09-13 MED ORDER — BENZOCAINE-MENTHOL 20-0.5 % EX AERO
1.0000 "application " | INHALATION_SPRAY | CUTANEOUS | Status: DC | PRN
  Administered 2010-09-13: 1 via TOPICAL

## 2010-09-13 MED ORDER — MONTELUKAST SODIUM 10 MG PO TABS
10.0000 mg | ORAL_TABLET | Freq: Every day | ORAL | Status: DC | PRN
  Filled 2010-09-13: qty 1

## 2010-09-13 MED ORDER — LACTATED RINGERS IV SOLN
INTRAVENOUS | Status: DC
  Administered 2010-09-13 (×2): via INTRAVENOUS

## 2010-09-13 MED ORDER — DIPHENHYDRAMINE HCL 25 MG PO CAPS: 25.0000 mg | ORAL_CAPSULE | Freq: Four times a day (QID) | ORAL | Status: DC | PRN

## 2010-09-13 MED ORDER — ZOLPIDEM TARTRATE 5 MG PO TABS: 5.0000 mg | ORAL_TABLET | Freq: Every evening | ORAL | Status: DC | PRN

## 2010-09-13 MED ORDER — LANOLIN HYDROUS EX OINT: TOPICAL_OINTMENT | CUTANEOUS | Status: DC | PRN

## 2010-09-13 MED ORDER — RHO D IMMUNE GLOBULIN 1500 UNIT/2ML IJ SOLN: 300.0000 ug | Freq: Once | INTRAMUSCULAR | Status: AC

## 2010-09-13 MED ORDER — PRENATAL PLUS 27-1 MG PO TABS: 1.0000 | ORAL_TABLET | Freq: Every day | ORAL | Status: DC

## 2010-09-13 MED ORDER — OXYTOCIN 20 UNITS IN LACTATED RINGERS INFUSION - SIMPLE
125.0000 mL/h | INTRAVENOUS | Status: DC
  Filled 2010-09-13: qty 1000

## 2010-09-13 MED ORDER — SIMETHICONE 80 MG PO CHEW
80.0000 mg | CHEWABLE_TABLET | ORAL | Status: DC | PRN
  Administered 2010-09-13: 80 mg via ORAL

## 2010-09-13 MED ORDER — FLUTICASONE PROPIONATE HFA 44 MCG/ACT IN AERO
1.0000 | INHALATION_SPRAY | Freq: Two times a day (BID) | RESPIRATORY_TRACT | Status: DC
  Administered 2010-09-13 – 2010-09-14 (×4): 1 via RESPIRATORY_TRACT
  Filled 2010-09-13 (×14): qty 1

## 2010-09-13 MED ORDER — MEASLES, MUMPS & RUBELLA VAC ~~LOC~~ INJ
0.5000 mL | INJECTION | Freq: Once | SUBCUTANEOUS | Status: DC
  Filled 2010-09-13: qty 0.5

## 2010-09-13 NOTE — Op Note (Signed)
At 12:53 AM a viable female was delivered via Vaginal, Spontaneous Delivery (Presentation: ; Occiput Anterior). APGAR: 9, 9; weight .6lbs 13 oz  Placenta status: , . Cord: 3 vessels with the following complications: None.  Anesthesia: Epidural and local Episiotomy: None  Lacerations: second degree midline Suture Repair:3-0 vicryl  Est. Blood Loss 400 ml.Marland Kitchen

## 2010-09-13 NOTE — Progress Notes (Signed)
  Day of delivery: BP acceptable. Urine output excellent. On magnesium sulfate 2 grams per hour. Labs are fine. Plan to continue magnesium for 24 hours.

## 2010-09-14 LAB — RH IG WORKUP (INCLUDES ABO/RH)

## 2010-09-14 LAB — CBC
Hemoglobin: 10.1 g/dL — ABNORMAL LOW (ref 12.0–15.0)
RBC: 3.49 MIL/uL — ABNORMAL LOW (ref 3.87–5.11)

## 2010-09-14 MED ORDER — FLUTICASONE PROPIONATE HFA 44 MCG/ACT IN AERO
1.0000 | INHALATION_SPRAY | Freq: Two times a day (BID) | RESPIRATORY_TRACT | Status: DC
  Filled 2010-09-14: qty 10.6

## 2010-09-14 NOTE — Progress Notes (Signed)
  Afebrile, BP normal. Feels fine. I have stopped the MGSO4 and will transfer her to the floor. Weight loss 4 pounds over the last 24 hours.

## 2010-09-14 NOTE — Progress Notes (Signed)
Magnesium d/c'd 7/15 @0645 

## 2010-09-14 NOTE — Plan of Care (Signed)
Problem: Phase I Progression Outcomes Goal: Other Phase I Outcomes/Goals Outcome: Completed/Met Date Met:  09/14/10 IV magnesium for preeclampsia  Problem: Phase II Progression Outcomes Goal: Other Phase II Outcomes/Goals Outcome: Progressing Stable off magnesium

## 2010-09-15 MED ORDER — BENZOCAINE-MENTHOL 20-0.5 % EX AERO: 1.0000 "application " | INHALATION_SPRAY | Freq: Two times a day (BID) | CUTANEOUS | Status: DC | PRN

## 2010-09-15 MED ORDER — IBUPROFEN 600 MG PO TABS: 600.0000 mg | ORAL_TABLET | Freq: Four times a day (QID) | ORAL | Status: AC | PRN

## 2010-09-15 NOTE — Progress Notes (Signed)
#  2 afebrile BP is fine.

## 2010-09-15 NOTE — Brief Op Note (Signed)
Delivery Note  At 12:53 AM a viable female was delivered via Vaginal, Spontaneous Delivery (Presentation: ; Occiput Anterior). APGAR: 9, 9; weight .  Placenta status: , . Cord: 3 vessels with the following complications: None. Cord pH:   Anesthesia: Epidural  Episiotomy: None  Lacerations:  Suture Repair:   Est. Blood Loss (mL):  Mom to AICU. Baby to nursery-stable.  Melissa Mckay F  09/13/2010, 2:15 AM

## 2010-09-15 NOTE — Discharge Summary (Signed)
NAMEMarland Kitchen  Melissa Mckay, Melissa Mckay NO.:  [id number]  MEDICAL RECORD NO.:  [id number]  LOCATION:  9112                          FACILITY:  [facility]  PHYSICIAN:  Malachi Pro. Ambrose Mantle, M.D. DATE OF BIRTH:  May 12, 1980  DATE OF ADMISSION:  09/08/2010 DATE OF DISCHARGE:  09/15/2010                              DISCHARGE SUMMARY   This is a 30 year old white female admitted to the hospital on September 09, 2010 with preeclampsia based on blood pressures of 140/98 and 1.9 grams of protein and 24-hour urine specimen.  The patient was kept in antenatal for several days and then on July 12, she was begun on Cervidil and then on July 13 Pitocin and on September 13, 2010, she delivered a living female infant 6 pounds 13 ounces vaginally without problems.  She was treated with magnesium sulfate throughout the labor and delivery and for 24 hours postpartum.  Her labs were good, did not show any evidence of liver dysfunction or thrombocytopenia.  She did well postpartum and remained normotensive and on the second postpartum day was ready for discharge.  Her initial hemoglobin was 12.1, hematocrit 36.1, white count 13,900, platelet count 131,000.  SGOT and PT were normal.  On September 12, 2010, the labs were essentially unchanged.  On September 13, 2010, her platelet count was still 150,000 and on July 15 the day after delivery her hemoglobin was 10.1, hematocrit 30.4, white count 11,100.  On the second postpartum day, she was ready for discharge.  FINAL DIAGNOSES:  Intrauterine pregnancy at 37 weeks, delivered vertex, preeclampsia.  OPERATION:  Spontaneous delivery vertex, repair of laceration, second- degree midline laceration with 3-0 Vicryl.  FINAL CONDITION:  Improved.  Instructions include our regular discharge instruction booklet.  The patient is also advised to take her blood pressure at home and if her blood pressures are greater than 140/95 consistently to call our office. She is to return to the office in  6 weeks for followup examination. Motrin 600 mg 30 tablets 1 every 6 hours as needed for pain is given at discharge.     Malachi Pro. Ambrose Mantle, M.D.     TFH/MEDQ  D:  09/15/2010  T:  09/15/2010  Job:  [id number]

## 2010-09-17 ENCOUNTER — Inpatient Hospital Stay (HOSPITAL_COMMUNITY)
Admission: AD | Admit: 2010-09-17 | Discharge: 2010-09-17 | Disposition: A | Discharge status: Home / Self Care | Payer: BC Managed Care – PPO | Source: Ambulatory Visit | Attending: Obstetrics and Gynecology | Admitting: Obstetrics and Gynecology

## 2010-09-17 ENCOUNTER — Encounter (HOSPITAL_COMMUNITY): Payer: Self-pay

## 2010-09-17 DIAGNOSIS — O864 Pyrexia of unknown origin following delivery: Secondary | ICD-10-CM | POA: Insufficient documentation

## 2010-09-17 DIAGNOSIS — O135 Gestational [pregnancy-induced] hypertension without significant proteinuria, complicating the puerperium: Secondary | ICD-10-CM | POA: Insufficient documentation

## 2010-09-17 DIAGNOSIS — O165 Unspecified maternal hypertension, complicating the puerperium: Secondary | ICD-10-CM

## 2010-09-17 LAB — CBC
Hemoglobin: 9.8 g/dL — ABNORMAL LOW (ref 12.0–15.0)
MCHC: 33.3 g/dL (ref 30.0–36.0)
RDW: 15.1 % (ref 11.5–15.5)
WBC: 10.6 10*3/uL — ABNORMAL HIGH (ref 4.0–10.5)

## 2010-09-17 LAB — COMPREHENSIVE METABOLIC PANEL
ALT: 28 U/L (ref 0–35)
Albumin: 2.4 g/dL — ABNORMAL LOW (ref 3.5–5.2)
Alkaline Phosphatase: 92 U/L (ref 39–117)
Potassium: 3.8 mEq/L (ref 3.5–5.1)
Sodium: 139 mEq/L (ref 135–145)
Total Protein: 6.8 g/dL (ref 6.0–8.3)

## 2010-09-17 LAB — URINE MICROSCOPIC-ADD ON

## 2010-09-17 LAB — URINALYSIS, ROUTINE W REFLEX MICROSCOPIC
Glucose, UA: NEGATIVE mg/dL
Specific Gravity, Urine: 1.025 (ref 1.005–1.030)
pH: 6.5 (ref 5.0–8.0)

## 2010-09-17 MED ORDER — FENTANYL 2.5 MCG/ML BUPIVACAINE 1/10 % EPIDURAL INFUSION (WH - ANES)
INTRAMUSCULAR | Status: DC | PRN
Start: 2010-09-12 — End: 2010-09-17
  Administered 2010-09-12: 14 mL/h via EPIDURAL

## 2010-09-17 NOTE — Progress Notes (Signed)
Pt states since being released from the hospital on Monday has episodes of "heart speeding" and difficulty catching her breath. States this normally happens at night & occasionally when she is working around the house. Also complains that her "body" but mainly her back has been "stiff" after moving around in the house.

## 2010-09-17 NOTE — Progress Notes (Signed)
Elevated temperature 101 last night, no headache, has not checked blood pressure, had preclampsia, vaginal birth on 09/13/10

## 2010-09-17 NOTE — Anesthesia Postprocedure Evaluation (Signed)
  Anesthesia Post-op Note  Patient: Melissa Mckay  Procedure(s) Performed: * Lumbar Epidural for L & D *      Complications: No apparent anesthesia complications

## 2010-09-17 NOTE — Anesthesia Preprocedure Evaluation (Signed)
Anesthesia Evaluation  Name, MR# and DOB Patient awake  General Assessment Comment  Reviewed: Allergy & Precautions, H&P  and Patient's Chart, lab work & pertinent test results  Airway Mallampati: III TM Distance: >3 FB Neck ROM: full    Dental No notable dental hx (+) Teeth Intact   Pulmonaryneg pulmonary ROS    clear to auscultation  pulmonary exam normal   Cardiovascular hypertension, regular Normal   Neuro/PsychNegative Neurological ROS Negative Psych ROS  GI/Hepatic/Renal negative GI ROS, negative Liver ROS, and negative Renal ROS (+)       Endo/Other  Negative Endocrine ROS (+)  Morbid obesity Abdominal   Musculoskeletal  Hematology negative hematology ROS (+)   Peds  Reproductive/Obstetrics (+) Pregnancy   Anesthesia Other Findings             Anesthesia Physical Anesthesia Plan  ASA: III  Anesthesia Plan: Epidural   Post-op Pain Management:    Induction:   Airway Management Planned:   Additional Equipment:   Intra-op Plan:   Post-operative Plan:   Informed Consent: I have reviewed the patients History and Physical, chart, labs and discussed the procedure including the risks, benefits and alternatives for the proposed anesthesia with the patient or authorized representative who has indicated his/her understanding and acceptance.     Plan Discussed with: Anesthesiologist  Anesthesia Plan Comments:         Anesthesia Quick Evaluation

## 2010-09-17 NOTE — ED Provider Notes (Addendum)
History     Chief Complaint  Patient presents with  . Hypertension  . Fever    101 last night   Patient is a 30 y.o. female presenting with hypertension and fever. The history is provided by the patient.  Hypertension Associated symptoms include a fever.  Fever Primary symptoms of the febrile illness include fever.   Pt is 4 days PP with history of PIH and Magnesium Sulfate with pregnancy.  She has felt short of breath today and last night.  The symptoms are worse at night.  She is asthmatic and has not used her inhaler today but denies wheezing today.  She says this is not the same feeling she gets with asthma.  She called Dr. Ebony Hail office today and they called in orders for [facility] labs.  Pt denies headache or abdominal pain.  She does have decreased appetite, but no constipation.  She is breast feeding.  Baby is eating every 3 hours and pt's breasts are tender.  Milk has not come in yet.  Pt's bleeding is slowing down.  Pt states she is very puffy and feels stiff.She takes Ibuprofen with help.   Past Medical History  Diagnosis Date  . Asthma   . Hypothyroidism   . Chlamydia   . Genital HSV   . Normal pregnancy 09/10/2010  . Pregnancy induced hypertension 2012    preE G1    Past Surgical History  Procedure Date  . 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 Cancer Mother   . Anemia    . COPD    . Depression    . Diabetes type II    . Fibromyalgia    . Hypertension    . Thyroid disease      History  Substance Use Topics  . Smoking status: Never Smoker   . Smokeless tobacco: Not on file  . Alcohol Use: No    Allergies:  Allergies  Allergen Reactions  . Sulfa Antibiotics Hives and Shortness Of Breath    Prescriptions prior to admission  Medication Sig Dispense Refill  . ALBUTEROL IN Inhale 2 puffs into the lungs daily. As needed for wheezing       . benzocaine-Menthol (DERMOPLAST)  20-0.5 % AERO Apply 1 application topically 2 (two) times daily as needed (perineal discomfort).      . cetirizine (ZYRTEC ALLERGY) 10 MG tablet Take 10 mg by mouth daily as needed. Patient takes for allergies       . flintstones complete (FLINTSTONES) 60 MG chewable tablet Chew 2 tablets by mouth daily.        . fluticasone (FLONASE) 50 MCG/ACT nasal spray Place 1-2 sprays into the nose daily as needed.        Marland Kitchen ibuprofen (ADVIL,MOTRIN) 600 MG tablet Take 1 tablet (600 mg total) by mouth every 6 (six) hours as needed for pain.  30 tablet  1  . levothyroxine (SYNTHROID, LEVOTHROID) 50 MCG tablet Take 50 mcg by mouth daily.       . Mometasone Furoate (ASMANEX 30 METERED DOSES) 110 MCG/INH AEPB Inhale 1 puff into the lungs every evening. Patient takes for allergies       . montelukast (SINGULAIR) 10 MG tablet Take 10 mg by mouth daily as needed. As needed for allergies      . prenatal vitamin w/FE, FA (PRENATAL 1 + 1) 27-1 MG TABS Take 1 tablet  by mouth daily.          Review of Systems  Constitutional: Positive for fever.   Physical Exam   Blood pressure 128/62, pulse 90, temperature 98.9 F (37.2 C), temperature source Oral, resp. rate 18, height 5\' 4"  (1.626 m), weight 253 lb (114.76 kg), SpO2 97.00%, not currently breastfeeding.    Physical Exam  Vitals reviewed. Constitutional: She is oriented to person, place, and time. She appears well-developed and well-nourished.  Eyes: Pupils are equal, round, and reactive to light.  Neck: Normal range of motion. Neck supple.  Cardiovascular: Normal rate and regular rhythm.   Respiratory: Effort normal. She has wheezes. She has no rales.       Slight wheezing noted left upper lobe; advised pt to use inhaler, which she has with her.  Musculoskeletal: Normal range of motion. She exhibits edema.       No reddness or localized tenderness noted  Neurological: She is alert and oriented to person, place, and time.  Skin: Skin is warm and dry.    MAU  Course  ProceduresPIH labs pending; PIH labs normal; Dr. Ambrose Mantle notified Pt used her inhaler-pt felt better   Assessment and Plan  Pt d/c home to follow up with Dr. Guido Sander 09/17/2010, 5:08 PM

## 2010-09-17 NOTE — Progress Notes (Signed)
Dr. Ambrose Mantle reached via pager and given results of labs and BP's/O2 sats.  per request of S. Chase Picket, NP.

## 2011-03-28 ENCOUNTER — Encounter (HOSPITAL_COMMUNITY): Payer: Self-pay | Admitting: Emergency Medicine

## 2011-03-28 ENCOUNTER — Emergency Department (INDEPENDENT_AMBULATORY_CARE_PROVIDER_SITE_OTHER): Payer: BC Managed Care – PPO

## 2011-03-28 ENCOUNTER — Emergency Department (INDEPENDENT_AMBULATORY_CARE_PROVIDER_SITE_OTHER)
Admission: EM | Admit: 2011-03-28 | Discharge: 2011-03-28 | Disposition: A | Discharge status: Home / Self Care | Payer: BC Managed Care – PPO | Source: Home / Self Care | Attending: Emergency Medicine | Admitting: Emergency Medicine

## 2011-03-28 DIAGNOSIS — J189 Pneumonia, unspecified organism: Secondary | ICD-10-CM

## 2011-03-28 MED ORDER — ONDANSETRON 4 MG PO TBDP
ORAL_TABLET | ORAL | Status: AC
  Filled 2011-03-28: qty 1

## 2011-03-28 MED ORDER — ALBUTEROL SULFATE HFA 108 (90 BASE) MCG/ACT IN AERS: 2.0000 | INHALATION_SPRAY | RESPIRATORY_TRACT | Status: DC | PRN

## 2011-03-28 MED ORDER — ALBUTEROL SULFATE (5 MG/ML) 0.5% IN NEBU
5.0000 mg | INHALATION_SOLUTION | Freq: Once | RESPIRATORY_TRACT | Status: AC
  Administered 2011-03-28: 5 mg via RESPIRATORY_TRACT

## 2011-03-28 MED ORDER — IPRATROPIUM BROMIDE 0.02 % IN SOLN
0.5000 mg | Freq: Once | RESPIRATORY_TRACT | Status: AC
  Administered 2011-03-28: 0.5 mg via RESPIRATORY_TRACT

## 2011-03-28 MED ORDER — HYDROCODONE-ACETAMINOPHEN 7.5-500 MG/15ML PO SOLN: 5.0000 mL | Freq: Four times a day (QID) | ORAL | Status: AC | PRN

## 2011-03-28 MED ORDER — ONDANSETRON 4 MG PO TBDP
4.0000 mg | ORAL_TABLET | Freq: Once | ORAL | Status: AC
  Administered 2011-03-28: 4 mg via ORAL

## 2011-03-28 MED ORDER — PREDNISONE 20 MG PO TABS
ORAL_TABLET | ORAL | Status: AC
  Filled 2011-03-28: qty 1

## 2011-03-28 MED ORDER — ALBUTEROL SULFATE (5 MG/ML) 0.5% IN NEBU
INHALATION_SOLUTION | RESPIRATORY_TRACT | Status: AC
  Filled 2011-03-28: qty 1

## 2011-03-28 MED ORDER — ACETAMINOPHEN 325 MG PO TABS
ORAL_TABLET | ORAL | Status: AC
  Filled 2011-03-28: qty 1

## 2011-03-28 MED ORDER — PREDNISONE 20 MG PO TABS
ORAL_TABLET | ORAL | Status: AC
  Filled 2011-03-28: qty 3

## 2011-03-28 MED ORDER — IBUPROFEN 600 MG PO TABS: 600.0000 mg | ORAL_TABLET | Freq: Four times a day (QID) | ORAL | Status: AC | PRN

## 2011-03-28 MED ORDER — AZITHROMYCIN 250 MG PO TABS: 250.0000 mg | ORAL_TABLET | Freq: Every day | ORAL | Status: AC

## 2011-03-28 MED ORDER — PREDNISONE 20 MG PO TABS
60.0000 mg | ORAL_TABLET | Freq: Once | ORAL | Status: AC
  Administered 2011-03-28: 60 mg via ORAL

## 2011-03-28 MED ORDER — ACETAMINOPHEN 325 MG PO TABS
650.0000 mg | ORAL_TABLET | Freq: Once | ORAL | Status: AC
  Administered 2011-03-28: 650 mg via ORAL

## 2011-03-28 NOTE — ED Notes (Signed)
Fever onset yesterday.  cough, minimal sore throat, chest and back soreness "just feels bruised"

## 2011-03-28 NOTE — ED Notes (Signed)
Family at bedside.patient not in treatment room.

## 2011-03-28 NOTE — ED Notes (Signed)
Patient transported to X-ray 

## 2011-03-28 NOTE — ED Notes (Signed)
Patient has returned to treatment room, sipping on gingerale.

## 2011-03-28 NOTE — ED Provider Notes (Signed)
History     CSN: [id number]  Arrival date & time 03/28/11  4098   First MD Initiated Contact with Patient 03/28/11 [phone]      Chief Complaint  Patient presents with  . Fever    (Consider location/radiation/quality/duration/timing/severity/associated sxs/prior treatment) HPI Comments: Patient with cough, wheezing,  Cough,  shortness of breath, chest tightness starting yesterday. Fevers Tmax 102, and body aches starting yesterday. Mild sore throat, nausea. No rhinorrhea, ear pain, vomiting, diarrhea, urinary complaints. Decreased appetite but is tolerating by mouth Taking over-the-counter cold medications with temporary fever reduction. Has been using her albuterol every several hours for shortness breath without improvement. Patient's son recently with viral syndrome.   ROS as noted in HPI. All other ROS negative.   Patient is a 31 y.o. female presenting with fever. The history is provided by the patient.  Fever Primary symptoms of the febrile illness include fever, fatigue, cough, wheezing and shortness of breath. The current episode started yesterday. This is a new problem. The problem has been gradually worsening.    Past Medical History  Diagnosis Date  . Asthma   . Hypothyroidism   . Chlamydia   . Genital HSV   . Normal pregnancy 09/10/2010  . Pregnancy induced hypertension 2012    preE G1    Past Surgical History  Procedure Date  . 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 Cancer Mother   . Diabetes Mother   . Anemia    . COPD    . Depression    . Diabetes type II    . Fibromyalgia    . Hypertension    . Thyroid disease    . Diabetes Father     History  Substance Use Topics  . Smoking status: Never Smoker   . Smokeless tobacco: Not on file  . Alcohol Use: No    OB History    Grav Para Term Preterm Abortions TAB SAB Ect Mult Living   1 1 1  0 0 0 0 0 0 1      Review of  Systems  Constitutional: Positive for fever and fatigue.  Respiratory: Positive for cough, shortness of breath and wheezing.     Allergies  Sulfa antibiotics  Home Medications   Current Outpatient Rx  Name Route Sig Dispense Refill  . PHENTERMINE HCL PO Oral Take by mouth.    . ALBUTEROL SULFATE HFA 108 (90 BASE) MCG/ACT IN AERS Inhalation Inhale 2 puffs into the lungs every 4 (four) hours as needed for wheezing. Dispense with aerochamber 1 Inhaler 0  . ALBUTEROL IN Inhalation Inhale 2 puffs into the lungs daily. As needed for wheezing/athsma    . AZITHROMYCIN 250 MG PO TABS Oral Take 1 tablet (250 mg total) by mouth daily. 2 tabs po on day 1, 1 tab po on days 2-5 6 tablet 0  . BENZOCAINE-MENTHOL 20-0.5 % EX AERO Topical Apply 1 application topically 2 (two) times daily as needed (perineal discomfort).    Marland Kitchen FLINTSTONES COMPLETE 60 MG PO CHEW Oral Chew 2 tablets by mouth daily.      Marland Kitchen FLUTICASONE PROPIONATE 50 MCG/ACT NA SUSP Nasal Place 1-2 sprays into the nose daily as needed.      Marland Kitchen FLUTICASONE PROPIONATE  HFA 44 MCG/ACT IN AERO Inhalation Inhale 1 puff into the lungs daily. Currently using in place of Asmanex     . HYDROCODONE-ACETAMINOPHEN 7.5-500  MG/15ML PO SOLN Oral Take 5 mLs by mouth every 6 (six) hours as needed for pain. 120 mL 0  . IBUPROFEN 600 MG PO TABS Oral Take 1 tablet (600 mg total) by mouth every 6 (six) hours as needed for pain. 30 tablet 0  . LEVOTHYROXINE SODIUM 50 MCG PO TABS Oral Take 50 mcg by mouth daily.     . MOMETASONE FUROATE 110 MCG/INH IN AEPB Inhalation Inhale 1 puff into the lungs every evening. Patient takes for allergies and athsma    . MONTELUKAST SODIUM 10 MG PO TABS Oral Take 10 mg by mouth daily as needed. As needed for allergies    . PRENATAL PLUS 27-1 MG PO TABS Oral Take 1 tablet by mouth daily.        BP 120/79  Pulse 99  Temp(Src) 102.6 F (39.2 C) (Oral)  Resp 18  SpO2 100%  LMP 03/14/2011  Physical Exam  Nursing note and vitals  reviewed. Constitutional: She is oriented to person, place, and time. She appears well-developed and well-nourished.       Appears fatigued.  HENT:  Head: Normocephalic and atraumatic.  Right Ear: Tympanic membrane and ear canal normal.  Left Ear: Tympanic membrane and ear canal normal.  Nose: Mucosal edema and rhinorrhea present. No epistaxis.  Mouth/Throat: Uvula is midline and mucous membranes are normal. Posterior oropharyngeal erythema present. No oropharyngeal exudate.       (-) frontal, maxillary sinus tenderness  Eyes: Conjunctivae and EOM are normal. Pupils are equal, round, and reactive to light.  Neck: Normal range of motion. Neck supple.  Cardiovascular: Regular rhythm, normal heart sounds and normal pulses.  Tachycardia present.   Pulmonary/Chest: Effort normal and breath sounds normal. No respiratory distress. She has no wheezes. She has no rales.       Diffuse wheezing throughout all lung fields  Abdominal: She exhibits no distension. There is no tenderness. There is no rebound, no guarding and no CVA tenderness.  Musculoskeletal: Normal range of motion.  Lymphadenopathy:    She has no cervical adenopathy.  Neurological: She is alert and oriented to person, place, and time.  Skin: Skin is warm and dry. No rash noted.  Psychiatric: She has a normal mood and affect. Her behavior is normal. Judgment and thought content normal.    ED Course  Procedures (including critical care time)  Labs Reviewed - No data to display Dg Chest 2 View  03/28/2011  *RADIOLOGY REPORT*  Clinical Data: Fever and wheezing  CHEST - 2 VIEW  Comparison: None.  Findings: Normal cardiac silhouette.  There is partial obscuration of the right cardiac border.  No pleural fluid.  No pulmonary edema.  No pneumothorax. No acute osseous abnormality.  IMPRESSION: Right middle lobe pneumonia versus atelectasis.  Favor pneumonia.  Original Report Authenticated By: Genevive Bi, M.D.    1. Pneumonia        MDM  Patient with diffuse wheezing, fever. Has history of asthma. Finger this is most likely influenza-like illness exacerbating her asthma. However will check chest x-ray is a fever. Giving albuterol Atrovent, prednisone and we'll reevaluate.  On reevaluation, improved air movement, has slight wheezing at bases. Will treat this as pneumonia given radiological findings and fever. Home with azithromycin, bronchodilators, antitussives, NSAIDs and Tylenol when necessary fever. Patient to f/u with PMD in 2 days if no improvement.  Luiz Blare, MD 03/29/11 [phone]

## 2011-04-04 ENCOUNTER — Encounter (HOSPITAL_COMMUNITY): Payer: Self-pay | Admitting: Emergency Medicine

## 2011-09-30 ENCOUNTER — Emergency Department (HOSPITAL_COMMUNITY)
Admission: EM | Admit: 2011-09-30 | Discharge: 2011-09-30 | Disposition: A | Discharge status: Home / Self Care | Payer: BC Managed Care – PPO | Source: Home / Self Care

## 2011-09-30 ENCOUNTER — Encounter (HOSPITAL_COMMUNITY): Payer: Self-pay | Admitting: Emergency Medicine

## 2011-09-30 DIAGNOSIS — R21 Rash and other nonspecific skin eruption: Secondary | ICD-10-CM

## 2011-09-30 NOTE — ED Notes (Signed)
Patient reports not feeling well Friday, woke with fever, chills, generalized pain.  Saturday, same as Friday.  Sunday was the same.  No uti symptoms.  Then Monday did not have a fever.  Yesterday woke with recurrent body aches and sore throat.  Today neck sore to touch, throat sore.  Denies ear pain.  Reports minimal cough over the weekend.  Saw physician yesterday dr Irene Limbo hussain at [facility].  Dx viral infection and wrote for antibiotic, but did not get filled.  Patient woke this am with generalized red rash-burning and itchy, specific arm joints and knees aching.  Reports generalized swelling.

## 2011-09-30 NOTE — ED Notes (Signed)
Patient is resting comfortably.  Patient reports she is waiting on discharge papers.  Assured it would not be too much longer.  Patient pleasant and declined any needs at present

## 2011-09-30 NOTE — ED Provider Notes (Addendum)
History     CSN: [id number]  Arrival date & time 09/30/11  1116   First MD Initiated Contact with Patient 09/30/11 1131      Chief Complaint  Patient presents with  . Rash    (Consider location/radiation/quality/duration/timing/severity/associated sxs/prior treatment) HPI Comments: No new medications.  Patient is a 31 y.o. female presenting with rash. The history is provided by the patient.  Rash  This is a new problem. The current episode started more than 2 days ago. The problem has not changed since onset.There has been no fever. The rash is present on the right arm, left arm, right upper leg and left upper leg. The pain is at a severity of 2/10. Pertinent negatives include no blisters. She has tried nothing for the symptoms.    Past Medical History  Diagnosis Date  . Asthma   . Hypothyroidism   . Chlamydia   . Genital HSV   . Normal pregnancy 09/10/2010  . Pregnancy induced hypertension 2012    preE G1    Past Surgical History  Procedure Date  . 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 Cancer Mother   . Diabetes Mother   . Anemia    . COPD    . Depression    . Diabetes type II    . Fibromyalgia    . Hypertension    . Thyroid disease    . Diabetes Father     History  Substance Use Topics  . Smoking status: Never Smoker   . Smokeless tobacco: Not on file  . Alcohol Use: No    OB History    Grav Para Term Preterm Abortions TAB SAB Ect Mult Living   1 1 1  0 0 0 0 0 0 1      Review of Systems  Constitutional: Negative for chills, activity change and appetite change.  HENT: Positive for sore throat. Negative for drooling, trouble swallowing, neck pain, dental problem, voice change, sinus pressure and ear discharge.   Respiratory: Negative.   Genitourinary: Negative for vaginal discharge.  Skin: Positive for rash.  Neurological: Positive for headaches. Negative for  dizziness and light-headedness.    Allergies  Sulfa antibiotics  Home Medications   Current Outpatient Rx  Name Route Sig Dispense Refill  . ONE-DAILY MULTI VITAMINS PO TABS Oral Take 1 tablet by mouth daily.    Marland Kitchen NORGESTIMATE-ETH ESTRADIOL 0.25-35 MG-MCG PO TABS Oral Take 1 tablet by mouth daily.    . ALBUTEROL SULFATE HFA 108 (90 BASE) MCG/ACT IN AERS Inhalation Inhale 2 puffs into the lungs every 4 (four) hours as needed for wheezing. Dispense with aerochamber 1 Inhaler 0  . ALBUTEROL IN Inhalation Inhale 2 puffs into the lungs daily. As needed for wheezing/athsma    . BENZOCAINE-MENTHOL 20-0.5 % EX AERO Topical Apply 1 application topically 2 (two) times daily as needed (perineal discomfort).    Marland Kitchen FLINTSTONES COMPLETE 60 MG PO CHEW Oral Chew 2 tablets by mouth daily.      Marland Kitchen FLUTICASONE PROPIONATE 50 MCG/ACT NA SUSP Nasal Place 1-2 sprays into the nose daily as needed.      Marland Kitchen FLUTICASONE PROPIONATE  HFA 44 MCG/ACT IN AERO Inhalation Inhale 1 puff into the lungs daily. Currently using in place of Asmanex     . LEVOTHYROXINE SODIUM 50 MCG PO TABS Oral Take 50 mcg by mouth daily.     Marland Kitchen  MOMETASONE FUROATE 110 MCG/INH IN AEPB Inhalation Inhale 1 puff into the lungs every evening. Patient takes for allergies and athsma    . MONTELUKAST SODIUM 10 MG PO TABS Oral Take 10 mg by mouth daily as needed. As needed for allergies    . PHENTERMINE HCL PO Oral Take by mouth.    Marland Kitchen PRENATAL PLUS 27-1 MG PO TABS Oral Take 1 tablet by mouth daily.        BP 126/76  Pulse 86  Temp 98.8 F (37.1 C) (Oral)  Resp 18  SpO2 100%  LMP 09/30/2011  Breastfeeding? No  Physical Exam  Nursing note and vitals reviewed. Constitutional: Vital signs are normal. She appears well-developed and well-nourished.  Non-toxic appearance. She does not appear ill. No distress.  HENT:  Mouth/Throat: Mucous membranes are normal. Normal dentition. Posterior oropharyngeal erythema present. No oropharyngeal exudate or  tonsillar abscesses.  Neck: Trachea normal and normal range of motion. No mass present.  Cardiovascular: Normal rate.   Pulmonary/Chest: Breath sounds normal. No respiratory distress. She has no wheezes.  Musculoskeletal:       Right shoulder: She exhibits normal range of motion, no tenderness, no swelling and no pain.       Right wrist: She exhibits normal range of motion, no swelling and no effusion.       Left wrist: She exhibits normal range of motion, no tenderness, no swelling and no effusion.       Right hand: Normal.       Left hand: Normal.  Lymphadenopathy:    She has cervical adenopathy.       Left cervical: Superficial cervical adenopathy present.  Neurological: She is alert.  Skin: Rash noted. No petechiae noted. Rash is papular and urticarial. Rash is not vesicular.       ED Course  Procedures (including critical care time)  Labs Reviewed - No data to display No results found.   No diagnosis found.    MDM  31 year old female no significant past medical history, she recently saw her doctor who prescribed oral antibiotics which she has not filled. Her child just had a viral exanthem this infection about one week prior to admission as per patient. She had 2 days of fever 5 days prior to her visit and then developed this diffuse rash blanchable to palpation mainly in her arms and legs. The most likely cause would be a viral exanthem. We'll continue Tylenol or NSAIDs for fatigue. He's had no further fevers.       Marinda Elk, MD 09/30/11 1245  Marinda Elk, MD 09/30/11 1251  Marinda Elk, MD 09/30/11 [phone]

## 2014-01-01 ENCOUNTER — Encounter (HOSPITAL_COMMUNITY): Payer: Self-pay | Admitting: Emergency Medicine

## 2014-01-31 ENCOUNTER — Encounter (HOSPITAL_COMMUNITY): Payer: Self-pay

## 2014-01-31 ENCOUNTER — Inpatient Hospital Stay (HOSPITAL_COMMUNITY)
Admission: AD | Admit: 2014-01-31 | Discharge: 2014-02-01 | Disposition: A | Discharge status: Home / Self Care | Payer: BC Managed Care – PPO | Source: Ambulatory Visit | Attending: Obstetrics and Gynecology | Admitting: Obstetrics and Gynecology

## 2014-01-31 ENCOUNTER — Inpatient Hospital Stay (HOSPITAL_COMMUNITY): Payer: BC Managed Care – PPO

## 2014-01-31 DIAGNOSIS — J069 Acute upper respiratory infection, unspecified: Secondary | ICD-10-CM | POA: Diagnosis not present

## 2014-01-31 DIAGNOSIS — O360920 Maternal care for other rhesus isoimmunization, second trimester, not applicable or unspecified: Secondary | ICD-10-CM | POA: Diagnosis not present

## 2014-01-31 DIAGNOSIS — R059 Cough, unspecified: Secondary | ICD-10-CM

## 2014-01-31 DIAGNOSIS — J45909 Unspecified asthma, uncomplicated: Secondary | ICD-10-CM | POA: Insufficient documentation

## 2014-01-31 DIAGNOSIS — R05 Cough: Secondary | ICD-10-CM

## 2014-01-31 DIAGNOSIS — O209 Hemorrhage in early pregnancy, unspecified: Secondary | ICD-10-CM | POA: Insufficient documentation

## 2014-01-31 DIAGNOSIS — Z3A08 8 weeks gestation of pregnancy: Secondary | ICD-10-CM | POA: Insufficient documentation

## 2014-01-31 DIAGNOSIS — O360910 Maternal care for other rhesus isoimmunization, first trimester, not applicable or unspecified: Secondary | ICD-10-CM | POA: Diagnosis not present

## 2014-01-31 DIAGNOSIS — O99511 Diseases of the respiratory system complicating pregnancy, first trimester: Secondary | ICD-10-CM | POA: Diagnosis not present

## 2014-01-31 LAB — URINALYSIS, ROUTINE W REFLEX MICROSCOPIC
BILIRUBIN URINE: NEGATIVE
GLUCOSE, UA: NEGATIVE mg/dL
Hgb urine dipstick: NEGATIVE
KETONES UR: NEGATIVE mg/dL
Leukocytes, UA: NEGATIVE
NITRITE: NEGATIVE
PH: 7.5 (ref 5.0–8.0)
Protein, ur: NEGATIVE mg/dL
SPECIFIC GRAVITY, URINE: 1.01 (ref 1.005–1.030)
Urobilinogen, UA: 0.2 mg/dL (ref 0.0–1.0)

## 2014-01-31 LAB — ABO/RH: ABO/RH(D): A NEG

## 2014-01-31 LAB — CBC
HEMATOCRIT: 39.5 % (ref 36.0–46.0)
HEMOGLOBIN: 14 g/dL (ref 12.0–15.0)
MCH: 29.3 pg (ref 26.0–34.0)
MCHC: 35.4 g/dL (ref 30.0–36.0)
MCV: 82.6 fL (ref 78.0–100.0)
Platelets: 139 10*3/uL — ABNORMAL LOW (ref 150–400)
RBC: 4.78 MIL/uL (ref 3.87–5.11)
RDW: 13.6 % (ref 11.5–15.5)
WBC: 5.8 10*3/uL (ref 4.0–10.5)

## 2014-01-31 LAB — WET PREP, GENITAL
Clue Cells Wet Prep HPF POC: NONE SEEN
Trich, Wet Prep: NONE SEEN
YEAST WET PREP: NONE SEEN

## 2014-01-31 LAB — HCG, QUANTITATIVE, PREGNANCY: HCG, BETA CHAIN, QUANT, S: 65139 m[IU]/mL — AB (ref <5)

## 2014-01-31 MED ORDER — RHO D IMMUNE GLOBULIN 1500 UNIT/2ML IJ SOSY
300.0000 ug | PREFILLED_SYRINGE | Freq: Once | INTRAMUSCULAR | Status: AC
  Administered 2014-01-31: 300 ug via INTRAMUSCULAR
  Filled 2014-01-31: qty 2

## 2014-01-31 MED ORDER — METOCLOPRAMIDE HCL 10 MG PO TABS
10.0000 mg | ORAL_TABLET | Freq: Once | ORAL | Status: AC
  Administered 2014-01-31: 10 mg via ORAL
  Filled 2014-01-31: qty 1

## 2014-01-31 NOTE — MAU Provider Note (Signed)
History     CSN: [id number]  Arrival date and time: 01/31/14 1722   First Provider Initiated Contact with Patient 01/31/14 2219      Chief Complaint  Patient presents with  . Fever  . Generalized Body Aches  . Fatigue  . Vaginal Bleeding  . Headache  . Sore Throat  . Cough   HPI Comments: Melissa Mckay 33 y.o. G2P1001 [redacted]w[redacted]d presents to MAU with 2 issues first is vaginal bleeding that started today and second is respiratory symptoms that started one week ago with low grade fevers, cough mostly nonproductive and body aches. She has asthma and has been taking her medications faithfully. She has a history of several episodes of pneumonia. Pt is A negative blood type  Fever  Associated symptoms include coughing and nausea.  Vaginal Bleeding Associated symptoms include a fever and nausea.  Headache  Associated symptoms include coughing, a fever and nausea.  Sore Throat  Associated symptoms include coughing.  Cough Associated symptoms include a fever.      Past Medical History  Diagnosis Date  . Asthma   . Hypothyroidism   . Chlamydia   . Genital HSV   . Normal pregnancy 09/10/2010  . Pregnancy induced hypertension 2012    preE G1    Past Surgical History  Procedure Laterality Date  . 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 Tonsilectomy, adenoidectomy, bilateral myringotomy and tubes      Family History  Problem Relation Age of Onset  . Cancer Mother   . Diabetes Mother   . Anemia    . COPD    . Depression    . Diabetes type II    . Fibromyalgia    . Hypertension    . Thyroid disease    . Diabetes Father     History  Substance Use Topics  . Smoking status: Never Smoker   . Smokeless tobacco: Not on file  . Alcohol Use: No    Allergies:  Allergies  Allergen Reactions  . Sulfa Antibiotics Hives and Shortness Of Breath    Prescriptions prior to admission  Medication Sig Dispense Refill Last Dose  . albuterol  (PROVENTIL HFA;VENTOLIN HFA) 108 (90 BASE) MCG/ACT inhaler Inhale 2 puffs into the lungs every 4 (four) hours as needed for wheezing. Dispense with aerochamber 1 Inhaler 0   . ALBUTEROL IN Inhale 2 puffs into the lungs daily. As needed for wheezing/athsma   09/16/2010 at Unknown  . benzocaine-Menthol (DERMOPLAST) 20-0.5 % AERO Apply 1 application topically 2 (two) times daily as needed (perineal discomfort).   09/17/2010 at Unknown  . flintstones complete (FLINTSTONES) 60 MG chewable tablet Chew 2 tablets by mouth daily.     Past Week at Unknown  . fluticasone (FLONASE) 50 MCG/ACT nasal spray Place 1-2 sprays into the nose daily as needed.     Past Week at Unknown  . fluticasone (FLOVENT HFA) 44 MCG/ACT inhaler Inhale 1 puff into the lungs daily. Currently using in place of Asmanex    09/17/2010 at Unknown  . levothyroxine (SYNTHROID, LEVOTHROID) 50 MCG tablet Take 50 mcg by mouth daily.    09/17/2010 at Unknown  . Mometasone Furoate (ASMANEX 30 METERED DOSES) 110 MCG/INH AEPB Inhale 1 puff into the lungs every evening. Patient takes for allergies and athsma   Past Week at Unknown  . montelukast (SINGULAIR) 10 MG tablet Take 10 mg by mouth daily as needed. As needed for allergies  Past Week at Unknown  . Multiple Vitamin (MULTIVITAMIN) tablet Take 1 tablet by mouth daily.     . norgestimate-ethinyl estradiol (ORTHO-CYCLEN,SPRINTEC,PREVIFEM) 0.25-35 MG-MCG tablet Take 1 tablet by mouth daily.     Marland Kitchen PHENTERMINE HCL PO Take by mouth.     . prenatal vitamin w/FE, FA (PRENATAL 1 + 1) 27-1 MG TABS Take 1 tablet by mouth daily.         Review of Systems  Constitutional: Positive for fever.  Respiratory: Positive for cough.   Gastrointestinal: Positive for nausea.  Genitourinary: Positive for vaginal bleeding.       Vaginal bleeding  Musculoskeletal: Negative.   Neurological: Negative.   Psychiatric/Behavioral: Negative.    Physical Exam   Blood pressure 128/75, pulse 94, temperature 99.8 F (37.7  C), temperature source Oral, resp. rate 20, height 5\' 4"  (1.626 m), weight 105.235 kg (232 lb), last menstrual period 12/04/2013, SpO2 100 %.  Physical Exam  Constitutional: She is oriented to person, place, and time. She appears well-developed and well-nourished. No distress.  HENT:  Head: Normocephalic and atraumatic.  Eyes: Pupils are equal, round, and reactive to light.  Cardiovascular: Normal rate, regular rhythm and normal heart sounds.   Respiratory: Effort normal. She has wheezes.  Right lower lobe with decreased breath sounds and wheezes  GI: Soft. Bowel sounds are normal.  Genitourinary:  Genital:external negative Vaginal:no bleeding Cervix:closed/ thick Bimanual:nontender   Musculoskeletal: Normal range of motion.  Neurological: She is alert and oriented to person, place, and time.  Skin: Skin is warm and dry.  Psychiatric: She has a normal mood and affect. Her behavior is normal. Thought content normal.   Results for orders placed or performed during the hospital encounter of 01/31/14 (from the past 24 hour(s))  Urinalysis, Routine w reflex microscopic     Status: None   Collection Time: 01/31/14  5:58 PM  Result Value Ref Range   Color, Urine YELLOW YELLOW   APPearance CLEAR CLEAR   Specific Gravity, Urine 1.010 1.005 - 1.030   pH 7.5 5.0 - 8.0   Glucose, UA NEGATIVE NEGATIVE mg/dL   Hgb urine dipstick NEGATIVE NEGATIVE   Bilirubin Urine NEGATIVE NEGATIVE   Ketones, ur NEGATIVE NEGATIVE mg/dL   Protein, ur NEGATIVE NEGATIVE mg/dL   Urobilinogen, UA 0.2 0.0 - 1.0 mg/dL   Nitrite NEGATIVE NEGATIVE   Leukocytes, UA NEGATIVE NEGATIVE  CBC     Status: Abnormal   Collection Time: 01/31/14  7:52 PM  Result Value Ref Range   WBC 5.8 4.0 - 10.5 K/uL   RBC 4.78 3.87 - 5.11 MIL/uL   Hemoglobin 14.0 12.0 - 15.0 g/dL   HCT 16.1 09.6 - 04.5 %   MCV 82.6 78.0 - 100.0 fL   MCH 29.3 26.0 - 34.0 pg   MCHC 35.4 30.0 - 36.0 g/dL   RDW 40.9 81.1 - 91.4 %   Platelets 139 (L)  150 - 400 K/uL  ABO/Rh     Status: None   Collection Time: 01/31/14  7:52 PM  Result Value Ref Range   ABO/RH(D) A NEG   hCG, quantitative, pregnancy     Status: Abnormal   Collection Time: 01/31/14  7:52 PM  Result Value Ref Range   hCG, Beta Chain, Quant, S 65139 (H) <5 mIU/mL  Rh IG workup (includes ABO/Rh)     Status: None (Preliminary result)   Collection Time: 01/31/14  7:52 PM  Result Value Ref Range   Gestational Age(Wks) 8    ABO/RH(D)  A NEG    Unit Number [id number]    Blood Component Type RHIG    Unit division 00    Status of Unit ISSUED    Transfusion Status OK TO TRANSFUSE   Wet prep, genital     Status: Abnormal   Collection Time: 01/31/14 10:30 PM  Result Value Ref Range   Yeast Wet Prep HPF POC NONE SEEN NONE SEEN   Trich, Wet Prep NONE SEEN NONE SEEN   Clue Cells Wet Prep HPF POC NONE SEEN NONE SEEN   WBC, Wet Prep HPF POC MODERATE (A) NONE SEEN   Dg Chest 2 View  02/01/2014   CLINICAL DATA:  Acute onset of cough. Current history of moderate asthma. Initial encounter.  EXAM: CHEST  2 VIEW  COMPARISON:  Chest radiograph performed 03/28/2011  FINDINGS: The lungs are well-aerated and clear. There is no evidence of focal opacification, pleural effusion or pneumothorax.  The heart is normal in size; the mediastinal contour is within normal limits. No acute osseous abnormalities are seen.  IMPRESSION: No acute cardiopulmonary process seen.   Electronically Signed   By: Roanna RaiderJeffery  Chang M.D.   On: 02/01/2014 00:17   [place] Ob Comp Less 14 Wks  01/31/2014   CLINICAL DATA:  33 year old pregnant female with vaginal bleeding/ spotting today. Estimated gestational age of [redacted] weeks 2 days by LMP. Initial encounter.  EXAM: OBSTETRIC <14 WK US AND TRANSVAGINAL OB US  TECHNIQUE: Both transabdominal and transvaginal ultrasound examinations were performed for complete evaluation of the gestation as well as the maternal uterus, adnexal regions, and pelvic cul-de-sac. Transvaginal technique  was performed to assess early pregnancy.  COMPARISON:  None.  FINDINGS: Intrauterine gestational sac: Visualized/normal in shape.  Yolk sac:  Visualized  Embryo:  Visualized  Cardiac Activity: Visualized  Heart Rate:  178 bpm  CRL:   20  mm   8 w 5  d                  US EDC: 09/07/2014  Maternal uterus/adnexae: A small subchronic hemorrhage is noted.  The ovaries are unremarkable.  A trace amount of free fluid is noted.  IMPRESSION: Single living intrauterine gestation with estimated gestational age of [redacted] weeks 5 days by this ultrasound.  Small subchorionic hemorrhage.   Electronically Signed   By: Laveda AbbeJeff  Hu M.D.   On: 01/31/2014 22:12   [place] Ob Transvaginal  01/31/2014   CLINICAL DATA:  33 year old pregnant female with vaginal bleeding/ spotting today. Estimated gestational age of [redacted] weeks 2 days by LMP. Initial encounter.  EXAM: OBSTETRIC <14 WK US AND TRANSVAGINAL OB US  TECHNIQUE: Both transabdominal and transvaginal ultrasound examinations were performed for complete evaluation of the gestation as well as the maternal uterus, adnexal regions, and pelvic cul-de-sac. Transvaginal technique was performed to assess early pregnancy.  COMPARISON:  None.  FINDINGS: Intrauterine gestational sac: Visualized/normal in shape.  Yolk sac:  Visualized  Embryo:  Visualized  Cardiac Activity: Visualized  Heart Rate:  178 bpm  CRL:   20  mm   8 w 5  d                  US EDC: 09/07/2014  Maternal uterus/adnexae: A small subchronic hemorrhage is noted.  The ovaries are unremarkable.  A trace amount of free fluid is noted.  IMPRESSION: Single living intrauterine gestation with estimated gestational age of [redacted] weeks 5 days by this ultrasound.  Small subchorionic hemorrhage.   Electronically Signed  By: Laveda AbbeJeff  Hu M.D.   On: 01/31/2014 22:12   .  MAU Course  Procedures  MDM Wet prep, GC, Chlamydia, CBC, UA, U/S, ABORh, Quant Reglan 10 mg CXR Rhogam for bleeding Reviewed case with Dr Ellyn HackBovard  Assessment and Plan   A:  Vaginal bleeding first trimester URI RH negative  P: Above labs and ultrasound were reviewed with patient Rhogam 300 mcg given Z-Pack  Tylenol # 3 po for cough and discomforts Follow up with Dr Lyndel SafeBovard  Cletus Paris, Rubbie BattiestLinda Miller 02/01/2014, 12:30 AM

## 2014-01-31 NOTE — MAU Note (Signed)
More flu-like symptoms began a week ago. Spotting began today, no intercourse since the 20th of Nov.

## 2014-01-31 NOTE — MAU Note (Signed)
Patient states she has asthma. Has been doubling up on her inhaler use. States she has had a low grade fever only at night from 99.9 to 100.3., none during the day. States she has had flu like symptoms with generalized body aches, headache, cough, sore throat, nausea with no vomiting. Had light spotting today on and off.

## 2014-01-31 NOTE — MAU Note (Signed)
Melissa RodneyLinda Barefoot auscultating breath sounds.

## 2014-02-01 LAB — GC/CHLAMYDIA PROBE AMP
CT Probe RNA: NEGATIVE
GC Probe RNA: NEGATIVE

## 2014-02-01 LAB — RH IG WORKUP (INCLUDES ABO/RH)
ABO/RH(D): A NEG
Gestational Age(Wks): 8
Unit division: 0

## 2014-02-01 MED ORDER — AZITHROMYCIN 250 MG PO TABS: 250.0000 mg | ORAL_TABLET | Freq: Every day | ORAL | Status: DC

## 2014-02-01 MED ORDER — ACETAMINOPHEN-CODEINE #3 300-30 MG PO TABS: 1.0000 | ORAL_TABLET | Freq: Four times a day (QID) | ORAL | Status: DC | PRN

## 2014-02-01 NOTE — Discharge Instructions (Signed)
Cough, Adult  A cough is a reflex. It helps you clear your throat and airways. A cough can help heal your body. A cough can last 2 or 3 weeks (acute) or may last more than 8 weeks (chronic). Some common causes of a cough can include an infection, allergy, or a cold. HOME CARE  Only take medicine as told by your doctor.  If given, take your medicines (antibiotics) as told. Finish them even if you start to feel better.  Use a cold steam vaporizer or humidifier in your home. This can help loosen thick spit (secretions).  Sleep so you are almost sitting up (semi-upright). Use pillows to do this. This helps reduce coughing.  Rest as needed.  Stop smoking if you smoke. GET HELP RIGHT AWAY IF:  You have yellowish-white fluid (pus) in your thick spit.  Your cough gets worse.  Your medicine does not reduce coughing, and you are losing sleep.  You cough up blood.  You have trouble breathing.  Your pain gets worse and medicine does not help.  You have a fever. MAKE SURE YOU:   Understand these instructions.  Will watch your condition.  Will get help right away if you are not doing well or get worse. Document Released: 10/30/2010 Document Revised: 07/03/2013 Document Reviewed: 10/30/2010 [facility] Patient Information 2015 [place], [place]. This information is not intended to replace advice given to you by your health care provider. Make sure you discuss any questions you have with your health care provider.  Cool Mist Vaporizers Vaporizers may help relieve the symptoms of a cough and cold. They add moisture to the air, which helps mucus to become thinner and less sticky. This makes it easier to breathe and cough up secretions. Cool mist vaporizers do not cause serious burns like hot mist vaporizers, which may also be called steamers or humidifiers. Vaporizers have not been proven to help with colds. You should not use a vaporizer if you are allergic to mold. HOME CARE  INSTRUCTIONS  Follow the package instructions for the vaporizer.  Do not use anything other than distilled water in the vaporizer.  Do not run the vaporizer all of the time. This can cause mold or bacteria to grow in the vaporizer.  Clean the vaporizer after each time it is used.  Clean and dry the vaporizer well before storing it.  Stop using the vaporizer if worsening respiratory symptoms develop. Document Released: 11/14/2003 Document Revised: 02/21/2013 Document Reviewed: 07/06/2012 [facility] Patient Information 2015 [place], [place]. This information is not intended to replace advice given to you by your health care provider. Make sure you discuss any questions you have with your health care provider. Rh Incompatibility Rh incompatibility is a condition that occurs during pregnancy if a woman has Rh-negative blood and her baby has Rh-positive blood. "Rh-negative" and "Rh-positive" refer to whether or not the blood has an Rh factor. An Rh factor is a specific protein found on the surface of red blood cells. If a woman has Rh factor, she is Rh-positive. If she does not have an Rh factor, she is Rh-negative. Having or not having an Rh factor does not affect the mother's general health. However, it can cause problems during pregnancy.  WHAT KIND OF PROBLEMS CAN Rh INCOMPATIBILITY CAUSE? During pregnancy, blood from the baby can cross into the mother's bloodstream, especially during delivery. If a mother is Rh-negative and the baby is Rh-positive, the mother's defense system will react to the baby's blood as if it was a  foreign substance and will create proteins (antibodies). This is called sensitization. Once the mother is sensitized, her Rh antibodies will cross the placenta to the baby and attack the baby's Rh-positive blood as if it is a harmful substance.  Rh incompatibility can also happen if the Rh-negative pregnant woman is exposed to the Rh factor during a blood transfusion with Rh-positive  blood.  HOW DOES THIS CONDITION AFFECT MY BABY? The Rh antibodies that attack and destroy the baby's red blood cells can lead to hemolytic disease in the baby. Hemolytic disease is when the red blood cells break down. This can cause:   Yellowing of the skin and eyes (jaundice).  The body to not have enough healthy red blood cells (anemia).   Brain damage.   Heart failure.   Death.  These antibodies usually do not cause problems during a first pregnancy. This is because the blood from the baby often times crosses into the mother's bloodstream during delivery, and the baby is born before many of the antibodies can develop. However, the antibodies stay in your body once they have formed. Because of this, Rh incompatibility is more likely to cause problems in second or later pregnancies (if the baby is Rh-positive).  HOW IS THIS CONDITION DIAGNOSED? When a woman becomes pregnant, blood tests may be done to find out her blood type and Rh factor. If the woman is Rh-negative, she also may have another blood test called an antibody screen. The antibody screen shows whether she has Rh antibodies in her blood. If she does, it means she was exposed to Rh-positive blood before, and she is at risk for Rh incompatibility.  To find out whether the baby is developing hemolytic anemia and how serious it is, caregivers may use more advanced tests, such as ultrasonography (commonly known as ultrasound).  HOW IS Rh INCOMPATIBILITY TREATED?  Rh incompatibility is treated with a shot of medicine called Rho (D) immune globulin. This medicine keeps the woman's body from making antibodies that can cause serious problems in the baby or future babies.  Two shots will be given, one at around your seventh month of pregnancy and the other within 72 hours of your baby being born. If you are Rh-negative, you will need this medicine every time you have a baby with Rh-positive blood. If you already have antibodies in your  blood, Rho (D) immune globulin will not help. Your doctor will not give you this medicine, but will watch your pregnancy closely for problems instead.  This shot may also be given to an Rh-negative woman when the risk of blood transfer between the mom and baby is high. The risk is high with:   An amniocentesis.   A miscarriage or an abortion.   An ectopic pregnancy.   Any vaginal bleeding during pregnancy.  Document Released: 08/08/2001 Document Revised: 02/21/2013 Document Reviewed: 05/31/2012 [facility] Patient Information 2015 [place], [place]. This information is not intended to replace advice given to you by your health care provider. Make sure you discuss any questions you have with your health care provider. Vaginal Bleeding During Pregnancy, First Trimester A small amount of bleeding (spotting) from the vagina is relatively common in early pregnancy. It usually stops on its own. Various things may cause bleeding or spotting in early pregnancy. Some bleeding may be related to the pregnancy, and some may not. In most cases, the bleeding is normal and is not a problem. However, bleeding can also be a sign of something serious. Be sure to  tell your health care provider about any vaginal bleeding right away. Some possible causes of vaginal bleeding during the first trimester include:  Infection or inflammation of the cervix.  Growths (polyps) on the cervix.  Miscarriage or threatened miscarriage.  Pregnancy tissue has developed outside of the uterus and in a fallopian tube (tubal pregnancy).  Tiny cysts have developed in the uterus instead of pregnancy tissue (molar pregnancy). HOME CARE INSTRUCTIONS  Watch your condition for any changes. The following actions may help to lessen any discomfort you are feeling:  Follow your health care provider's instructions for limiting your activity. If your health care provider orders bed rest, you may need to stay in bed and only get up to use  the bathroom. However, your health care provider may allow you to continue light activity.  If needed, make plans for someone to help with your regular activities and responsibilities while you are on bed rest.  Keep track of the number of pads you use each day, how often you change pads, and how soaked (saturated) they are. Write this down.  Do not use tampons. Do not douche.  Do not have sexual intercourse or orgasms until approved by your health care provider.  If you pass any tissue from your vagina, save the tissue so you can show it to your health care provider.  Only take over-the-counter or prescription medicines as directed by your health care provider.  Do not take aspirin because it can make you bleed.  Keep all follow-up appointments as directed by your health care provider. SEEK MEDICAL CARE IF:  You have any vaginal bleeding during any part of your pregnancy.  You have cramps or labor pains.  You have a fever, not controlled by medicine. SEEK IMMEDIATE MEDICAL CARE IF:   You have severe cramps in your back or belly (abdomen).  You pass large clots or tissue from your vagina.  Your bleeding increases.  You feel light-headed or weak, or you have fainting episodes.  You have chills.  You are leaking fluid or have a gush of fluid from your vagina.  You pass out while having a bowel movement. MAKE SURE YOU:  Understand these instructions.  Will watch your condition.  Will get help right away if you are not doing well or get worse. Document Released: 11/26/2004 Document Revised: 02/21/2013 Document Reviewed: 10/24/2012 [facility] Patient Information 2015 [place], [place]. This information is not intended to replace advice given to you by your health care provider. Make sure you discuss any questions you have with your health care provider.

## 2014-04-03 ENCOUNTER — Encounter (HOSPITAL_COMMUNITY): Payer: Self-pay | Admitting: Emergency Medicine

## 2014-04-03 ENCOUNTER — Emergency Department (INDEPENDENT_AMBULATORY_CARE_PROVIDER_SITE_OTHER)
Admission: EM | Admit: 2014-04-03 | Discharge: 2014-04-03 | Disposition: A | Discharge status: Home / Self Care | Payer: BLUE CROSS/BLUE SHIELD | Source: Home / Self Care | Attending: Family Medicine | Admitting: Family Medicine

## 2014-04-03 DIAGNOSIS — Z331 Pregnant state, incidental: Secondary | ICD-10-CM

## 2014-04-03 DIAGNOSIS — J45901 Unspecified asthma with (acute) exacerbation: Secondary | ICD-10-CM

## 2014-04-03 DIAGNOSIS — Z3490 Encounter for supervision of normal pregnancy, unspecified, unspecified trimester: Secondary | ICD-10-CM

## 2014-04-03 MED ORDER — ALBUTEROL SULFATE (2.5 MG/3ML) 0.083% IN NEBU
INHALATION_SOLUTION | RESPIRATORY_TRACT | Status: AC
  Filled 2014-04-03: qty 6

## 2014-04-03 MED ORDER — ALBUTEROL SULFATE (5 MG/ML) 0.5% IN NEBU
5.0000 mg | INHALATION_SOLUTION | Freq: Once | RESPIRATORY_TRACT | Status: AC
  Administered 2014-04-03: 5 mg via RESPIRATORY_TRACT

## 2014-04-03 MED ORDER — PREDNISONE 50 MG PO TABS: 50.0000 mg | ORAL_TABLET | Freq: Every day | ORAL | Status: DC

## 2014-04-03 MED ORDER — IPRATROPIUM BROMIDE 0.06 % NA SOLN: 2.0000 | Freq: Four times a day (QID) | NASAL | Status: DC

## 2014-04-03 MED ORDER — PREDNISONE 20 MG PO TABS
60.0000 mg | ORAL_TABLET | Freq: Once | ORAL | Status: AC
  Administered 2014-04-03: 60 mg via ORAL

## 2014-04-03 MED ORDER — PREDNISONE 20 MG PO TABS
ORAL_TABLET | ORAL | Status: AC
  Filled 2014-04-03: qty 3

## 2014-04-03 NOTE — ED Notes (Signed)
Reports on set of sob.  Hx of asthma.  No relief with prescribed inhaler.  Pt is 17 wks. Pregnant.   Symptoms present since yesterday and gradually has gotten worse.

## 2014-04-03 NOTE — Discharge Instructions (Signed)
Thank you for coming in today. Use albuterol as needed Take prednisone daily Call or go to the emergency room if you get worse, have trouble breathing, have chest pains, or palpitations.    Asthma Asthma is a recurring condition in which the airways tighten and narrow. Asthma can make it difficult to breathe. It can cause coughing, wheezing, and shortness of breath. Asthma episodes, also called asthma attacks, range from minor to life-threatening. Asthma cannot be cured, but medicines and lifestyle changes can help control it. CAUSES Asthma is believed to be caused by inherited (genetic) and environmental factors, but its exact cause is unknown. Asthma may be triggered by allergens, lung infections, or irritants in the air. Asthma triggers are different for each person. Common triggers include:   Animal dander.  Dust mites.  Cockroaches.  Pollen from trees or grass.  Mold.  Smoke.  Air pollutants such as dust, household cleaners, hair sprays, aerosol sprays, paint fumes, strong chemicals, or strong odors.  Cold air, weather changes, and winds (which increase molds and pollens in the air).  Strong emotional expressions such as crying or laughing hard.  Stress.  Certain medicines (such as aspirin) or types of drugs (such as beta-blockers).  Sulfites in foods and drinks. Foods and drinks that may contain sulfites include dried fruit, potato chips, and sparkling grape juice.  Infections or inflammatory conditions such as the flu, a cold, or an inflammation of the nasal membranes (rhinitis).  Gastroesophageal reflux disease (GERD).  Exercise or strenuous activity. SYMPTOMS Symptoms may occur immediately after asthma is triggered or many hours later. Symptoms include:  Wheezing.  Excessive nighttime or early morning coughing.  Frequent or severe coughing with a common cold.  Chest tightness.  Shortness of breath. DIAGNOSIS  The diagnosis of asthma is made by a review of  your medical history and a physical exam. Tests may also be performed. These may include:  Lung function studies. These tests show how much air you breathe in and out.  Allergy tests.  Imaging tests such as X-rays. TREATMENT  Asthma cannot be cured, but it can usually be controlled. Treatment involves identifying and avoiding your asthma triggers. It also involves medicines. There are 2 classes of medicine used for asthma treatment:   Controller medicines. These prevent asthma symptoms from occurring. They are usually taken every day.  Reliever or rescue medicines. These quickly relieve asthma symptoms. They are used as needed and provide short-term relief. Your health care provider will help you create an asthma action plan. An asthma action plan is a written plan for managing and treating your asthma attacks. It includes a list of your asthma triggers and how they may be avoided. It also includes information on when medicines should be taken and when their dosage should be changed. An action plan may also involve the use of a device called a peak flow meter. A peak flow meter measures how well the lungs are working. It helps you monitor your condition. HOME CARE INSTRUCTIONS   Take medicines only as directed by your health care provider. Speak with your health care provider if you have questions about how or when to take the medicines.  Use a peak flow meter as directed by your health care provider. Record and keep track of readings.  Understand and use the action plan to help minimize or stop an asthma attack without needing to seek medical care.  Control your home environment in the following ways to help prevent asthma attacks:  Do  not smoke. Avoid being exposed to secondhand smoke.  Change your heating and air conditioning filter regularly.  Limit your use of fireplaces and wood stoves.  Get rid of pests (such as roaches and mice) and their droppings.  Throw away plants if you  see mold on them.  Clean your floors and dust regularly. Use unscented cleaning products.  Try to have someone else vacuum for you regularly. Stay out of rooms while they are being vacuumed and for a short while afterward. If you vacuum, use a dust mask from a hardware store, a double-layered or microfilter vacuum cleaner bag, or a vacuum cleaner with a HEPA filter.  Replace carpet with wood, tile, or vinyl flooring. Carpet can trap dander and dust.  Use allergy-proof pillows, mattress covers, and box spring covers.  Wash bed sheets and blankets every week in hot water and dry them in a dryer.  Use blankets that are made of polyester or cotton.  Clean bathrooms and kitchens with bleach. If possible, have someone repaint the walls in these rooms with mold-resistant paint. Keep out of the rooms that are being cleaned and painted.  Wash hands frequently. SEEK MEDICAL CARE IF:   You have wheezing, shortness of breath, or a cough even if taking medicine to prevent attacks.  The colored mucus you cough up (sputum) is thicker than usual.  Your sputum changes from clear or white to yellow, green, gray, or bloody.  You have any problems that may be related to the medicines you are taking (such as a rash, itching, swelling, or trouble breathing).  You are using a reliever medicine more than 2-3 times per week.  Your peak flow is still at 50-79% of your personal best after following your action plan for 1 hour.  You have a fever. SEEK IMMEDIATE MEDICAL CARE IF:   You seem to be getting worse and are unresponsive to treatment during an asthma attack.  You are short of breath even at rest.  You get short of breath when doing very little physical activity.  You have difficulty eating, drinking, or talking due to asthma symptoms.  You develop chest pain.  You develop a fast heartbeat.  You have a bluish color to your lips or fingernails.  You are light-headed, dizzy, or  faint.  Your peak flow is less than 50% of your personal best. MAKE SURE YOU:   Understand these instructions.  Will watch your condition.  Will get help right away if you are not doing well or get worse. Document Released: 02/16/2005 Document Revised: 07/03/2013 Document Reviewed: 09/15/2012 [facility] Patient Information 2015 [place], [place]. This information is not intended to replace advice given to you by your health care provider. Make sure you discuss any questions you have with your health care provider.

## 2014-04-03 NOTE — ED Provider Notes (Signed)
Melissa Mckay is a 34 y.o. female who presents to Urgent Care today for Asthma. Patient developed shortness of breath today. She has a history of asthma. She's been using her albuterol which helps. However the albuterol only helps temporarily. She is currently [redacted] weeks pregnant. No chest pain or palpitations. She notes wheezing and shortness of breath.   Past Medical History  Diagnosis Date  . Asthma   . Hypothyroidism   . Chlamydia   . Genital HSV   . Normal pregnancy 09/10/2010  . Pregnancy induced hypertension 2012    preE G1   Past Surgical History  Procedure Laterality Date  . 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 Tonsilectomy, adenoidectomy, bilateral myringotomy and tubes     History  Substance Use Topics  . Smoking status: Never Smoker   . Smokeless tobacco: Not on file  . Alcohol Use: No   ROS as above Medications: Current Facility-Administered Medications  Medication Dose Route Frequency Provider Last Rate Last Dose  . albuterol (PROVENTIL) (5 MG/ML) 0.5% nebulizer solution 5 mg  5 mg Nebulization Once Rodolph BongEvan S Harryette Shuart, MD       Current Outpatient Prescriptions  Medication Sig Dispense Refill  . ALBUTEROL IN Inhale 2 puffs into the lungs daily. As needed for wheezing/athsma    . cefdinir (OMNICEF) 125 MG/5ML suspension Take by mouth 2 (two) times daily.    . fluticasone (FLONASE) 50 MCG/ACT nasal spray Place 1-2 sprays into the nose daily as needed.      Marland Kitchen. levothyroxine (SYNTHROID, LEVOTHROID) 50 MCG tablet Take 50 mcg by mouth daily.     . Mometasone Furoate (ASMANEX 30 METERED DOSES) 110 MCG/INH AEPB Inhale 1 puff into the lungs every evening. Patient takes for allergies and athsma    . prenatal vitamin w/FE, FA (PRENATAL 1 + 1) 27-1 MG TABS Take 1 tablet by mouth daily.      Marland Kitchen. acetaminophen-codeine (TYLENOL #3) 300-30 MG per tablet Take 1-2 tablets by mouth every 6 (six) hours as needed for moderate pain. 15 tablet 0  . azithromycin  (ZITHROMAX Z-PAK) 250 MG tablet Take 1 tablet (250 mg total) by mouth daily. 6 tablet 0  . benzocaine-Menthol (DERMOPLAST) 20-0.5 % AERO Apply 1 application topically 2 (two) times daily as needed (perineal discomfort).    . flintstones complete (FLINTSTONES) 60 MG chewable tablet Chew 2 tablets by mouth daily.      . fluticasone (FLOVENT HFA) 44 MCG/ACT inhaler Inhale 1 puff into the lungs daily. Currently using in place of Asmanex     . montelukast (SINGULAIR) 10 MG tablet Take 10 mg by mouth daily as needed. As needed for allergies    . Multiple Vitamin (MULTIVITAMIN) tablet Take 1 tablet by mouth daily.     Allergies  Allergen Reactions  . Sulfa Antibiotics Hives and Shortness Of Breath     Exam:  BP 132/73 mmHg  Pulse 85  Temp(Src) 97.5 F (36.4 C) (Oral)  Resp 18  SpO2 100%  LMP 12/04/2013 Gen: increased work of breathing HEENT: EOMI,  MMM clear nasal discharge Lungs: increased work of breathing wheezing present bilaterally Heart: RRR no MRG Abd: NABS, Soft. Nondistended, Nontender Exts: Brisk capillary refill, warm and well perfused.   Patient was given 5 mg nebulized albuterol, and Felt better  No results found for this or any previous visit (from the past 24 hour(s)). No results found.  Assessment and Plan: 34 y.o. female with asthma exacerbation.  complicated by pregnancy. Benefit for prednisone outweighs risk. Treat with course of prednisone and albuterol. Return as needed.  Discussed warning signs or symptoms. Please see discharge instructions. Patient expresses understanding.     Rodolph Bong, MD 04/03/14 [phone]

## 2014-05-19 ENCOUNTER — Encounter (HOSPITAL_COMMUNITY): Payer: Self-pay | Admitting: Emergency Medicine

## 2014-05-19 ENCOUNTER — Emergency Department (HOSPITAL_COMMUNITY)
Admission: EM | Admit: 2014-05-19 | Discharge: 2014-05-20 | Disposition: A | Discharge status: Home / Self Care | Payer: BLUE CROSS/BLUE SHIELD | Attending: Emergency Medicine | Admitting: Emergency Medicine

## 2014-05-19 DIAGNOSIS — Z8619 Personal history of other infectious and parasitic diseases: Secondary | ICD-10-CM | POA: Insufficient documentation

## 2014-05-19 DIAGNOSIS — Z3490 Encounter for supervision of normal pregnancy, unspecified, unspecified trimester: Secondary | ICD-10-CM

## 2014-05-19 DIAGNOSIS — O99512 Diseases of the respiratory system complicating pregnancy, second trimester: Secondary | ICD-10-CM | POA: Insufficient documentation

## 2014-05-19 DIAGNOSIS — E039 Hypothyroidism, unspecified: Secondary | ICD-10-CM | POA: Insufficient documentation

## 2014-05-19 DIAGNOSIS — Z87891 Personal history of nicotine dependence: Secondary | ICD-10-CM | POA: Insufficient documentation

## 2014-05-19 DIAGNOSIS — Z79899 Other long term (current) drug therapy: Secondary | ICD-10-CM | POA: Insufficient documentation

## 2014-05-19 DIAGNOSIS — J4541 Moderate persistent asthma with (acute) exacerbation: Secondary | ICD-10-CM

## 2014-05-19 DIAGNOSIS — Z7952 Long term (current) use of systemic steroids: Secondary | ICD-10-CM | POA: Insufficient documentation

## 2014-05-19 DIAGNOSIS — Z3A24 24 weeks gestation of pregnancy: Secondary | ICD-10-CM | POA: Insufficient documentation

## 2014-05-19 DIAGNOSIS — Z792 Long term (current) use of antibiotics: Secondary | ICD-10-CM | POA: Insufficient documentation

## 2014-05-19 DIAGNOSIS — O132 Gestational [pregnancy-induced] hypertension without significant proteinuria, second trimester: Secondary | ICD-10-CM | POA: Insufficient documentation

## 2014-05-19 DIAGNOSIS — Z7951 Long term (current) use of inhaled steroids: Secondary | ICD-10-CM | POA: Insufficient documentation

## 2014-05-19 DIAGNOSIS — O99282 Endocrine, nutritional and metabolic diseases complicating pregnancy, second trimester: Secondary | ICD-10-CM | POA: Insufficient documentation

## 2014-05-19 MED ORDER — IPRATROPIUM-ALBUTEROL 0.5-2.5 (3) MG/3ML IN SOLN
RESPIRATORY_TRACT | Status: AC
  Administered 2014-05-19: 3 mL via RESPIRATORY_TRACT
  Filled 2014-05-19: qty 3

## 2014-05-19 NOTE — ED Notes (Signed)
Patient here with complaint of asthma exacerbation since Friday. Has been using albuterol nebulizers and inhalers at home as well as Q-var without effect. Significant wheezing noted from bedside.

## 2014-05-20 MED ORDER — ALBUTEROL (5 MG/ML) CONTINUOUS INHALATION SOLN
10.0000 mg/h | INHALATION_SOLUTION | Freq: Once | RESPIRATORY_TRACT | Status: AC
  Administered 2014-05-20: 10 mg/h via RESPIRATORY_TRACT
  Filled 2014-05-20: qty 20

## 2014-05-20 MED ORDER — PREDNISONE 20 MG PO TABS: 60.0000 mg | ORAL_TABLET | Freq: Every day | ORAL | Status: DC

## 2014-05-20 MED ORDER — PREDNISONE 20 MG PO TABS
60.0000 mg | ORAL_TABLET | Freq: Once | ORAL | Status: AC
  Administered 2014-05-20: 60 mg via ORAL
  Filled 2014-05-20: qty 3

## 2014-05-20 MED ORDER — IPRATROPIUM-ALBUTEROL 0.5-2.5 (3) MG/3ML IN SOLN
3.0000 mL | Freq: Once | RESPIRATORY_TRACT | Status: AC
  Administered 2014-05-19: 3 mL via RESPIRATORY_TRACT

## 2014-05-20 NOTE — ED Notes (Signed)
Pt states that she feels better after the breathing treatment in triage. Pt NAD

## 2014-05-20 NOTE — ED Provider Notes (Signed)
CSN: [id number]     Arrival date & time 05/19/14  2343 History  This chart was scribed for Marisa Severinlga Rebacca Votaw, MD by Annye AsaAnna Dorsett, ED Scribe. This patient was seen in room A08C/A08C and the patient's care was started at 12:35 AM.    Chief Complaint  Patient presents with  . Asthma  . Wheezing   Patient is a 34 y.o. female presenting with asthma and wheezing. The history is provided by the patient. No language interpreter was used.  Asthma  Wheezing  HPI Comments: Melissa Mckay is a 5824 weeks pregnant 34 y.o. female with past medical history of asthma who presents to the Emergency Department complaining of wheezing associated with an asthma attack beginning yesterday and worsening throughout the day today. Patient reports that her albuterol nebulizer was not providing relief at home, prompting her to come to the ED tonight. She was seen for this same issue several weeks ago at a local Urgent Care.   She states she uses her inhalers 2-3x per day, each. Her asthma care is currently managed [facility], but she is looking to be seen by a pulmonologist soon.   Past Medical History  Diagnosis Date  . Asthma   . Hypothyroidism   . Chlamydia   . Genital HSV   . Normal pregnancy 09/10/2010  . Pregnancy induced hypertension 2012    preE G1   Past Surgical History  Procedure Laterality Date  . 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 Cancer Mother   . Diabetes Mother   . Anemia    . COPD    . Depression    . Diabetes type II    . Fibromyalgia    . Hypertension    . Thyroid disease    . Diabetes Father    History  Substance Use Topics  . Smoking status: Former Games developermoker  . Smokeless tobacco: Not on file  . Alcohol Use: No   OB History    Gravida Para Term Preterm AB TAB SAB Ectopic Multiple Living   2 1 1  0 0 0 0 0 0 1     Review of Systems  Respiratory: Positive for wheezing.   All other  systems reviewed and are negative.   Allergies  Sulfa antibiotics  Home Medications   Prior to Admission medications   Medication Sig Start Date End Date Taking? Authorizing Provider  acetaminophen-codeine (TYLENOL #3) 300-30 MG per tablet Take 1-2 tablets by mouth every 6 (six) hours as needed for moderate pain. 02/01/14   Delbert PhenixLinda M Barefoot, NP  ALBUTEROL IN Inhale 2 puffs into the lungs daily. As needed for wheezing/athsma    Historical Provider, MD  azithromycin (ZITHROMAX Z-PAK) 250 MG tablet Take 1 tablet (250 mg total) by mouth daily. 02/01/14   Delbert PhenixLinda M Barefoot, NP  benzocaine-Menthol (DERMOPLAST) 20-0.5 % AERO Apply 1 application topically 2 (two) times daily as needed (perineal discomfort). 09/15/10   Tracey Harrieshomas Henley, MD  cefdinir (OMNICEF) 125 MG/5ML suspension Take by mouth 2 (two) times daily.    Historical Provider, MD  flintstones complete (FLINTSTONES) 60 MG chewable tablet Chew 2 tablets by mouth daily.      Historical Provider, MD  fluticasone (FLONASE) 50 MCG/ACT nasal spray Place 1-2 sprays into the nose daily as needed.      Historical Provider, MD  fluticasone (FLOVENT HFA) 44 MCG/ACT inhaler Inhale 1 puff  into the lungs daily. Currently using in place of Asmanex     Historical Provider, MD  ipratropium (ATROVENT) 0.06 % nasal spray Place 2 sprays into both nostrils 4 (four) times daily. 04/03/14   Rodolph Bong, MD  levothyroxine (SYNTHROID, LEVOTHROID) 50 MCG tablet Take 50 mcg by mouth daily.     Historical Provider, MD  Mometasone Furoate (ASMANEX 30 METERED DOSES) 110 MCG/INH AEPB Inhale 1 puff into the lungs every evening. Patient takes for allergies and athsma    Historical Provider, MD  montelukast (SINGULAIR) 10 MG tablet Take 10 mg by mouth daily as needed. As needed for allergies    Historical Provider, MD  Multiple Vitamin (MULTIVITAMIN) tablet Take 1 tablet by mouth daily.    Historical Provider, MD  predniSONE (DELTASONE) 50 MG tablet Take 1 tablet (50 mg total) by  mouth daily. 04/03/14   Rodolph Bong, MD  prenatal vitamin w/FE, FA (PRENATAL 1 + 1) 27-1 MG TABS Take 1 tablet by mouth daily.      Historical Provider, MD   BP 119/64 mmHg  Pulse 93  Temp(Src) 98.6 F (37 C) (Oral)  Resp 18  SpO2 98%  LMP 12/04/2013 Physical Exam  Constitutional: She is oriented to person, place, and time. She appears well-developed and well-nourished. No distress.  HENT:  Head: Normocephalic and atraumatic.  Mouth/Throat: Oropharynx is clear and moist. No oropharyngeal exudate.  Moist mucous membranes  Eyes: EOM are normal. Pupils are equal, round, and reactive to light.  Neck: Normal range of motion. Neck supple. No JVD present.  Cardiovascular: Normal rate, regular rhythm and normal heart sounds.  Exam reveals no gallop and no friction rub.   No murmur heard. Pulmonary/Chest: Effort normal. No respiratory distress. She has wheezes (Inspiratory and expiratory wheezes; prolonged expiratory phase but is speaking in full sentences). She has no rales.  Abdominal: Soft. Bowel sounds are normal. She exhibits no mass. There is no tenderness. There is no rebound and no guarding.  Musculoskeletal: Normal range of motion. She exhibits no edema.  Moves all extremities normally.   Lymphadenopathy:    She has no cervical adenopathy.  Neurological: She is alert and oriented to person, place, and time. She displays normal reflexes.  Skin: Skin is warm and dry. No rash noted.  Psychiatric: She has a normal mood and affect. Her behavior is normal.  Nursing note and vitals reviewed.   ED Course  Procedures   DIAGNOSTIC STUDIES: Oxygen Saturation is 96% on RA, adequate by my interpretation.    COORDINATION OF CARE: 12:40 AM Discussed treatment plan with pt at bedside and pt agreed to plan.   Labs Review Labs Reviewed - No data to display  Imaging Review No results found.   EKG Interpretation None      MDM   Final diagnoses:  Acute asthma exacerbation, moderate  persistent  Pregnant   I personally performed the services described in this documentation, which was scribed in my presence. The recorded information has been reviewed and is accurate.  34 year old female G2 P1 at 24 weeks with asthma exacerbation since Friday.  Similar episode in February.  Patient reports similar problems during her prior pregnancy.  She has not seen pulmonology for her ongoing asthma.  Patient has been using home treatments without improvement.  No fevers or chills.  Some improvement after DuoNeb here.  Plan for hour long continuous neb and steroids.  Patient has been counseled over risks and benefits for albuterol and prednisone.  Given  her ongoing symptoms, however, I feel the benefits outweigh risks to her current pregnancy.  Patient much improved after hour long neb.  We'll refer her to pulmonology.    Marisa Severin, MD 05/20/14 [phone]

## 2014-05-20 NOTE — Discharge Instructions (Signed)
Asthma, Acute Bronchospasm °Acute bronchospasm caused by asthma is also referred to as an asthma attack. Bronchospasm means your air passages become narrowed. The narrowing is caused by inflammation and tightening of the muscles in the air tubes (bronchi) in your lungs. This can make it hard to breathe or cause you to wheeze and cough. °CAUSES °Possible triggers are: °· Animal dander from the skin, hair, or feathers of animals. °· Dust mites contained in house dust. °· Cockroaches. °· Pollen from trees or grass. °· Mold. °· Cigarette or tobacco smoke. °· Air pollutants such as dust, household cleaners, hair sprays, aerosol sprays, paint fumes, strong chemicals, or strong odors. °· Cold air or weather changes. Cold air may trigger inflammation. Winds increase molds and pollens in the air. °· Strong emotions such as crying or laughing hard. °· Stress. °· Certain medicines such as aspirin or beta-blockers. °· Sulfites in foods and drinks, such as dried fruits and wine. °· Infections or inflammatory conditions, such as a flu, cold, or inflammation of the nasal membranes (rhinitis). °· Gastroesophageal reflux disease (GERD). GERD is a condition where stomach acid backs up into your esophagus. °· Exercise or strenuous activity. °SIGNS AND SYMPTOMS  °· Wheezing. °· Excessive coughing, particularly at night. °· Chest tightness. °· Shortness of breath. °DIAGNOSIS  °Your health care provider will ask you about your medical history and perform a physical exam. A chest X-ray or blood testing may be performed to look for other causes of your symptoms or other conditions that may have triggered your asthma attack.  °TREATMENT  °Treatment is aimed at reducing inflammation and opening up the airways in your lungs.  Most asthma attacks are treated with inhaled medicines. These include quick relief or rescue medicines (such as bronchodilators) and controller medicines (such as inhaled corticosteroids). These medicines are sometimes  given through an inhaler or a nebulizer. Systemic steroid medicine taken by mouth or given through an IV tube also can be used to reduce the inflammation when an attack is moderate or severe. Antibiotic medicines are only used if a bacterial infection is present.  °HOME CARE INSTRUCTIONS  °· Rest. °· Drink plenty of liquids. This helps the mucus to remain thin and be easily coughed up. Only use caffeine in moderation and do not use alcohol until you have recovered from your illness. °· Do not smoke. Avoid being exposed to secondhand smoke. °· You play a critical role in keeping yourself in good health. Avoid exposure to things that cause you to wheeze or to have breathing problems. °· Keep your medicines up-to-date and available. Carefully follow your health care provider's treatment plan. °· Take your medicine exactly as prescribed. °· When pollen or pollution is bad, keep windows closed and use an air conditioner or go to places with air conditioning. °· Asthma requires careful medical care. See your health care provider for a follow-up as advised. If you are more than [redacted] weeks pregnant and you were prescribed any new medicines, let your obstetrician know about the visit and how you are doing. Follow up with your health care provider as directed. °· After you have recovered from your asthma attack, make an appointment with your outpatient doctor to talk about ways to reduce the likelihood of future attacks. If you do not have a doctor who manages your asthma, make an appointment with a primary care doctor to discuss your asthma. °SEEK IMMEDIATE MEDICAL CARE IF:  °· You are getting worse. °· You have trouble breathing. If severe, call your local   emergency services (911 in the [place] °· You develop chest pain or discomfort. °· You are vomiting. °· You are not able to keep fluids down. °· You are coughing up yellow, green, brown, or bloody sputum. °· You have a fever and your symptoms suddenly get worse. °· You have  trouble swallowing. °MAKE SURE YOU:  °· Understand these instructions. °· Will watch your condition. °· Will get help right away if you are not doing well or get worse. °Document Released: 06/03/2006 Document Revised: 02/21/2013 Document Reviewed: 08/24/2012 °Exit[employer]® Patient Information ©2015 [employer]. This information is not intended to replace advice given to you by your health care provider. Make sure you discuss any questions you have with your health care provider. ° °Asthma °Asthma is a recurring condition in which the airways tighten and narrow. Asthma can make it difficult to breathe. It can cause coughing, wheezing, and shortness of breath. Asthma episodes, also called asthma attacks, range from minor to life-threatening. Asthma cannot be cured, but medicines and lifestyle changes can help control it. °CAUSES °Asthma is believed to be caused by inherited (genetic) and environmental factors, but its exact cause is unknown. Asthma may be triggered by allergens, lung infections, or irritants in the air. Asthma triggers are different for each person. Common triggers include:  °· Animal dander. °· Dust mites. °· Cockroaches. °· Pollen from trees or grass. °· Mold. °· Smoke. °· Air pollutants such as dust, household cleaners, hair sprays, aerosol sprays, paint fumes, strong chemicals, or strong odors. °· Cold air, weather changes, and winds (which increase molds and pollens in the air). °· Strong emotional expressions such as crying or laughing hard. °· Stress. °· Certain medicines (such as aspirin) or types of drugs (such as beta-blockers). °· Sulfites in foods and drinks. Foods and drinks that may contain sulfites include dried fruit, potato chips, and sparkling grape juice. °· Infections or inflammatory conditions such as the flu, a cold, or an inflammation of the nasal membranes (rhinitis). °· Gastroesophageal reflux disease (GERD). °· Exercise or strenuous activity. °SYMPTOMS °Symptoms may occur  immediately after asthma is triggered or many hours later. Symptoms include: °· Wheezing. °· Excessive nighttime or early morning coughing. °· Frequent or severe coughing with a common cold. °· Chest tightness. °· Shortness of breath. °DIAGNOSIS  °The diagnosis of asthma is made by a review of your medical history and a physical exam. Tests may also be performed. These may include: °· Lung function studies. These tests show how much air you breathe in and out. °· Allergy tests. °· Imaging tests such as X-rays. °TREATMENT  °Asthma cannot be cured, but it can usually be controlled. Treatment involves identifying and avoiding your asthma triggers. It also involves medicines. There are 2 classes of medicine used for asthma treatment:  °· Controller medicines. These prevent asthma symptoms from occurring. They are usually taken every day. °· Reliever or rescue medicines. These quickly relieve asthma symptoms. They are used as needed and provide short-term relief. °Your health care provider will help you create an asthma action plan. An asthma action plan is a written plan for managing and treating your asthma attacks. It includes a list of your asthma triggers and how they may be avoided. It also includes information on when medicines should be taken and when their dosage should be changed. An action plan may also involve the use of a device called a peak flow meter. A peak flow meter measures how well the lungs are working. It helps you   monitor your condition. °HOME CARE INSTRUCTIONS  °· Take medicines only as directed by your health care provider. Speak with your health care provider if you have questions about how or when to take the medicines. °· Use a peak flow meter as directed by your health care provider. Record and keep track of readings. °· Understand and use the action plan to help minimize or stop an asthma attack without needing to seek medical care. °· Control your home environment in the following ways to  help prevent asthma attacks: °¨ Do not smoke. Avoid being exposed to secondhand smoke. °¨ Change your heating and air conditioning filter regularly. °¨ Limit your use of fireplaces and wood stoves. °¨ Get rid of pests (such as roaches and mice) and their droppings. °¨ Throw away plants if you see mold on them. °¨ Clean your floors and dust regularly. Use unscented cleaning products. °¨ Try to have someone else vacuum for you regularly. Stay out of rooms while they are being vacuumed and for a short while afterward. If you vacuum, use a dust mask from a hardware store, a double-layered or microfilter vacuum cleaner bag, or a vacuum cleaner with a HEPA filter. °¨ Replace carpet with wood, tile, or vinyl flooring. Carpet can trap dander and dust. °¨ Use allergy-proof pillows, mattress covers, and box spring covers. °¨ Wash bed sheets and blankets every week in hot water and dry them in a dryer. °¨ Use blankets that are made of polyester or cotton. °¨ Clean bathrooms and kitchens with bleach. If possible, have someone repaint the walls in these rooms with mold-resistant paint. Keep out of the rooms that are being cleaned and painted. °¨ Wash hands frequently. °SEEK MEDICAL CARE IF:  °· You have wheezing, shortness of breath, or a cough even if taking medicine to prevent attacks. °· The colored mucus you cough up (sputum) is thicker than usual. °· Your sputum changes from clear or white to yellow, green, gray, or bloody. °· You have any problems that may be related to the medicines you are taking (such as a rash, itching, swelling, or trouble breathing). °· You are using a reliever medicine more than 2-3 times per week. °· Your peak flow is still at 50-79% of your personal best after following your action plan for 1 hour. °· You have a fever. °SEEK IMMEDIATE MEDICAL CARE IF:  °· You seem to be getting worse and are unresponsive to treatment during an asthma attack. °· You are short of breath even at rest. °· You get  short of breath when doing very little physical activity. °· You have difficulty eating, drinking, or talking due to asthma symptoms. °· You develop chest pain. °· You develop a fast heartbeat. °· You have a bluish color to your lips or fingernails. °· You are light-headed, dizzy, or faint. °· Your peak flow is less than 50% of your personal best. °MAKE SURE YOU:  °· Understand these instructions. °· Will watch your condition. °· Will get help right away if you are not doing well or get worse. °Document Released: 02/16/2005 Document Revised: 07/03/2013 Document Reviewed: 09/15/2012 °ExitCare® Patient Information ©2015 [employer]. This information is not intended to replace advice given to you by your health care provider. Make sure you discuss any questions you have with your health care provider. ° °

## 2014-05-20 NOTE — ED Notes (Signed)
Pt. Left with all belongings and refused wheelchair 

## 2014-07-15 ENCOUNTER — Inpatient Hospital Stay (HOSPITAL_COMMUNITY)
Admission: AD | Admit: 2014-07-15 | Discharge: 2014-07-20 | DRG: 765 | Disposition: A | Discharge status: Home / Self Care | Payer: BLUE CROSS/BLUE SHIELD | Source: Ambulatory Visit | Attending: Obstetrics and Gynecology | Admitting: Obstetrics and Gynecology

## 2014-07-15 DIAGNOSIS — O9832 Other infections with a predominantly sexual mode of transmission complicating childbirth: Secondary | ICD-10-CM | POA: Diagnosis present

## 2014-07-15 DIAGNOSIS — O368190 Decreased fetal movements, unspecified trimester, not applicable or unspecified: Secondary | ICD-10-CM | POA: Insufficient documentation

## 2014-07-15 DIAGNOSIS — O9882 Other maternal infectious and parasitic diseases complicating childbirth: Secondary | ICD-10-CM | POA: Diagnosis present

## 2014-07-15 DIAGNOSIS — A6000 Herpesviral infection of urogenital system, unspecified: Secondary | ICD-10-CM | POA: Diagnosis present

## 2014-07-15 DIAGNOSIS — O368130 Decreased fetal movements, third trimester, not applicable or unspecified: Principal | ICD-10-CM | POA: Diagnosis present

## 2014-07-15 DIAGNOSIS — Z3490 Encounter for supervision of normal pregnancy, unspecified, unspecified trimester: Secondary | ICD-10-CM

## 2014-07-15 DIAGNOSIS — Z87891 Personal history of nicotine dependence: Secondary | ICD-10-CM

## 2014-07-15 DIAGNOSIS — J45909 Unspecified asthma, uncomplicated: Secondary | ICD-10-CM | POA: Diagnosis present

## 2014-07-15 DIAGNOSIS — E039 Hypothyroidism, unspecified: Secondary | ICD-10-CM | POA: Diagnosis present

## 2014-07-15 DIAGNOSIS — O99284 Endocrine, nutritional and metabolic diseases complicating childbirth: Secondary | ICD-10-CM | POA: Diagnosis present

## 2014-07-15 DIAGNOSIS — O368131 Decreased fetal movements, third trimester, fetus 1: Secondary | ICD-10-CM

## 2014-07-15 DIAGNOSIS — Z882 Allergy status to sulfonamides status: Secondary | ICD-10-CM

## 2014-07-15 DIAGNOSIS — O9952 Diseases of the respiratory system complicating childbirth: Secondary | ICD-10-CM | POA: Diagnosis present

## 2014-07-15 DIAGNOSIS — O368390 Maternal care for abnormalities of the fetal heart rate or rhythm, unspecified trimester, not applicable or unspecified: Secondary | ICD-10-CM | POA: Diagnosis present

## 2014-07-15 DIAGNOSIS — Z3A32 32 weeks gestation of pregnancy: Secondary | ICD-10-CM | POA: Insufficient documentation

## 2014-07-15 DIAGNOSIS — Z79899 Other long term (current) drug therapy: Secondary | ICD-10-CM

## 2014-07-15 DIAGNOSIS — Z7952 Long term (current) use of systemic steroids: Secondary | ICD-10-CM

## 2014-07-15 NOTE — MAU Note (Signed)
Pt states she has had decreased fetal movement. Has not felt the baby movement all day.

## 2014-07-16 ENCOUNTER — Encounter (HOSPITAL_COMMUNITY): Payer: Self-pay | Admitting: *Deleted

## 2014-07-16 ENCOUNTER — Inpatient Hospital Stay (HOSPITAL_COMMUNITY): Payer: BLUE CROSS/BLUE SHIELD

## 2014-07-16 ENCOUNTER — Encounter (HOSPITAL_COMMUNITY)
Admission: AD | Disposition: A | Discharge status: Home / Self Care | Payer: Self-pay | Source: Ambulatory Visit | Attending: Obstetrics and Gynecology

## 2014-07-16 ENCOUNTER — Inpatient Hospital Stay (HOSPITAL_COMMUNITY): Payer: BLUE CROSS/BLUE SHIELD | Admitting: Anesthesiology

## 2014-07-16 DIAGNOSIS — O368130 Decreased fetal movements, third trimester, not applicable or unspecified: Secondary | ICD-10-CM | POA: Diagnosis present

## 2014-07-16 DIAGNOSIS — Z882 Allergy status to sulfonamides status: Secondary | ICD-10-CM | POA: Diagnosis not present

## 2014-07-16 DIAGNOSIS — O368190 Decreased fetal movements, unspecified trimester, not applicable or unspecified: Secondary | ICD-10-CM | POA: Insufficient documentation

## 2014-07-16 DIAGNOSIS — E039 Hypothyroidism, unspecified: Secondary | ICD-10-CM | POA: Diagnosis present

## 2014-07-16 DIAGNOSIS — O99284 Endocrine, nutritional and metabolic diseases complicating childbirth: Secondary | ICD-10-CM | POA: Diagnosis present

## 2014-07-16 DIAGNOSIS — J45909 Unspecified asthma, uncomplicated: Secondary | ICD-10-CM | POA: Diagnosis present

## 2014-07-16 DIAGNOSIS — O9832 Other infections with a predominantly sexual mode of transmission complicating childbirth: Secondary | ICD-10-CM | POA: Diagnosis present

## 2014-07-16 DIAGNOSIS — O368390 Maternal care for abnormalities of the fetal heart rate or rhythm, unspecified trimester, not applicable or unspecified: Secondary | ICD-10-CM | POA: Diagnosis present

## 2014-07-16 DIAGNOSIS — Z7952 Long term (current) use of systemic steroids: Secondary | ICD-10-CM | POA: Diagnosis not present

## 2014-07-16 DIAGNOSIS — A6000 Herpesviral infection of urogenital system, unspecified: Secondary | ICD-10-CM | POA: Diagnosis present

## 2014-07-16 DIAGNOSIS — Z79899 Other long term (current) drug therapy: Secondary | ICD-10-CM | POA: Diagnosis not present

## 2014-07-16 DIAGNOSIS — O9952 Diseases of the respiratory system complicating childbirth: Secondary | ICD-10-CM | POA: Diagnosis present

## 2014-07-16 DIAGNOSIS — Z3A32 32 weeks gestation of pregnancy: Secondary | ICD-10-CM | POA: Insufficient documentation

## 2014-07-16 DIAGNOSIS — Z3490 Encounter for supervision of normal pregnancy, unspecified, unspecified trimester: Secondary | ICD-10-CM

## 2014-07-16 DIAGNOSIS — O9882 Other maternal infectious and parasitic diseases complicating childbirth: Secondary | ICD-10-CM | POA: Diagnosis present

## 2014-07-16 DIAGNOSIS — Z87891 Personal history of nicotine dependence: Secondary | ICD-10-CM | POA: Diagnosis not present

## 2014-07-16 LAB — COMPREHENSIVE METABOLIC PANEL
ALK PHOS: 74 U/L (ref 38–126)
ALT: 20 U/L (ref 14–54)
AST: 22 U/L (ref 15–41)
Albumin: 3.3 g/dL — ABNORMAL LOW (ref 3.5–5.0)
Anion gap: 11 (ref 5–15)
BUN: 9 mg/dL (ref 6–20)
CALCIUM: 8.8 mg/dL — AB (ref 8.9–10.3)
CO2: 24 mmol/L (ref 22–32)
Chloride: 100 mmol/L — ABNORMAL LOW (ref 101–111)
Creatinine, Ser: 0.52 mg/dL (ref 0.44–1.00)
GFR calc Af Amer: >60 mL/min (ref >60)
GFR calc non Af Amer: >60 mL/min (ref >60)
GLUCOSE: 82 mg/dL (ref 65–99)
POTASSIUM: 3.8 mmol/L (ref 3.5–5.1)
SODIUM: 135 mmol/L (ref 135–145)
Total Bilirubin: 0.3 mg/dL (ref 0.3–1.2)
Total Protein: 7.3 g/dL (ref 6.5–8.1)

## 2014-07-16 LAB — CBC
HCT: 38.7 % (ref 36.0–46.0)
Hemoglobin: 13.4 g/dL (ref 12.0–15.0)
MCH: 29.6 pg (ref 26.0–34.0)
MCHC: 34.6 g/dL (ref 30.0–36.0)
MCV: 85.6 fL (ref 78.0–100.0)
Platelets: 196 10*3/uL (ref 150–400)
RBC: 4.52 MIL/uL (ref 3.87–5.11)
RDW: 15.2 % (ref 11.5–15.5)
WBC: 11.8 10*3/uL — ABNORMAL HIGH (ref 4.0–10.5)

## 2014-07-16 SURGERY — Surgical Case
Anesthesia: Spinal | Site: Abdomen

## 2014-07-16 MED ORDER — DIBUCAINE 1 % RE OINT: 1.0000 "application " | TOPICAL_OINTMENT | RECTAL | Status: DC | PRN

## 2014-07-16 MED ORDER — ONDANSETRON HCL 4 MG/2ML IJ SOLN
INTRAMUSCULAR | Status: DC | PRN
  Administered 2014-07-16: 4 mg via INTRAVENOUS

## 2014-07-16 MED ORDER — SCOPOLAMINE 1 MG/3DAYS TD PT72: 1.0000 | MEDICATED_PATCH | Freq: Once | TRANSDERMAL | Status: DC

## 2014-07-16 MED ORDER — ALBUTEROL SULFATE (2.5 MG/3ML) 0.083% IN NEBU: 2.5000 mg | INHALATION_SOLUTION | Freq: Four times a day (QID) | RESPIRATORY_TRACT | Status: DC | PRN

## 2014-07-16 MED ORDER — ONDANSETRON HCL 4 MG/2ML IJ SOLN
INTRAMUSCULAR | Status: AC
  Filled 2014-07-16: qty 2

## 2014-07-16 MED ORDER — NALBUPHINE HCL 10 MG/ML IJ SOLN: 5.0000 mg | INTRAMUSCULAR | Status: DC | PRN

## 2014-07-16 MED ORDER — OXYTOCIN 40 UNITS IN LACTATED RINGERS INFUSION - SIMPLE MED: 62.5000 mL/h | INTRAVENOUS | Status: AC

## 2014-07-16 MED ORDER — LACTATED RINGERS IV SOLN: 500.0000 mL | INTRAVENOUS | Status: DC | PRN

## 2014-07-16 MED ORDER — LACTATED RINGERS IV SOLN: INTRAVENOUS | Status: DC

## 2014-07-16 MED ORDER — MEPERIDINE HCL 25 MG/ML IJ SOLN: 6.2500 mg | INTRAMUSCULAR | Status: DC | PRN

## 2014-07-16 MED ORDER — FENTANYL CITRATE (PF) 100 MCG/2ML IJ SOLN: 25.0000 ug | INTRAMUSCULAR | Status: DC | PRN

## 2014-07-16 MED ORDER — LANOLIN HYDROUS EX OINT: 1.0000 "application " | TOPICAL_OINTMENT | CUTANEOUS | Status: DC | PRN

## 2014-07-16 MED ORDER — MONTELUKAST SODIUM 10 MG PO TABS
10.0000 mg | ORAL_TABLET | Freq: Every day | ORAL | Status: DC
  Administered 2014-07-17 – 2014-07-19 (×4): 10 mg via ORAL
  Filled 2014-07-16 (×4): qty 1

## 2014-07-16 MED ORDER — BUPIVACAINE IN DEXTROSE 0.75-8.25 % IT SOLN
INTRATHECAL | Status: DC | PRN
  Administered 2014-07-16: 1.5 mL via INTRATHECAL

## 2014-07-16 MED ORDER — KETOROLAC TROMETHAMINE 30 MG/ML IJ SOLN
30.0000 mg | Freq: Four times a day (QID) | INTRAMUSCULAR | Status: DC | PRN
  Administered 2014-07-16: 30 mg via INTRAVENOUS

## 2014-07-16 MED ORDER — CITRIC ACID-SODIUM CITRATE 334-500 MG/5ML PO SOLN: 30.0000 mL | ORAL | Status: DC | PRN

## 2014-07-16 MED ORDER — CITRIC ACID-SODIUM CITRATE 334-500 MG/5ML PO SOLN
ORAL | Status: AC
  Administered 2014-07-16: 30 mL
  Filled 2014-07-16: qty 15

## 2014-07-16 MED ORDER — LACTATED RINGERS IV BOLUS (SEPSIS)
500.0000 mL | Freq: Once | INTRAVENOUS | Status: AC
  Administered 2014-07-16: 1000 mL via INTRAVENOUS

## 2014-07-16 MED ORDER — BETAMETHASONE SOD PHOS & ACET 6 (3-3) MG/ML IJ SUSP
12.0000 mg | INTRAMUSCULAR | Status: DC
  Administered 2014-07-16: 12 mg via INTRAMUSCULAR
  Filled 2014-07-16: qty 2

## 2014-07-16 MED ORDER — PRENATAL MULTIVITAMIN CH
1.0000 | ORAL_TABLET | Freq: Every day | ORAL | Status: DC
  Administered 2014-07-16 – 2014-07-19 (×4): 1 via ORAL
  Filled 2014-07-16 (×4): qty 1

## 2014-07-16 MED ORDER — TETANUS-DIPHTH-ACELL PERTUSSIS 5-2.5-18.5 LF-MCG/0.5 IM SUSP: 0.5000 mL | Freq: Once | INTRAMUSCULAR | Status: DC

## 2014-07-16 MED ORDER — ONDANSETRON HCL 4 MG/2ML IJ SOLN: 4.0000 mg | Freq: Three times a day (TID) | INTRAMUSCULAR | Status: DC | PRN

## 2014-07-16 MED ORDER — PHENYLEPHRINE 8 MG IN D5W 100 ML (0.08MG/ML) PREMIX OPTIME
INJECTION | INTRAVENOUS | Status: DC | PRN
  Administered 2014-07-16: 40 ug/min via INTRAVENOUS

## 2014-07-16 MED ORDER — MORPHINE SULFATE 0.5 MG/ML IJ SOLN
INTRAMUSCULAR | Status: AC
  Filled 2014-07-16: qty 10

## 2014-07-16 MED ORDER — ONDANSETRON HCL 4 MG/2ML IJ SOLN: 4.0000 mg | Freq: Four times a day (QID) | INTRAMUSCULAR | Status: DC | PRN

## 2014-07-16 MED ORDER — MENTHOL 3 MG MT LOZG: 1.0000 | LOZENGE | OROMUCOSAL | Status: DC | PRN

## 2014-07-16 MED ORDER — FENTANYL CITRATE (PF) 100 MCG/2ML IJ SOLN
INTRAMUSCULAR | Status: DC | PRN
  Administered 2014-07-16: 12.5 ug via INTRATHECAL

## 2014-07-16 MED ORDER — DIPHENHYDRAMINE HCL 25 MG PO CAPS: 25.0000 mg | ORAL_CAPSULE | Freq: Four times a day (QID) | ORAL | Status: DC | PRN

## 2014-07-16 MED ORDER — LORATADINE 10 MG PO TABS
10.0000 mg | ORAL_TABLET | Freq: Every day | ORAL | Status: DC
  Administered 2014-07-16 – 2014-07-19 (×4): 10 mg via ORAL
  Filled 2014-07-16 (×5): qty 1

## 2014-07-16 MED ORDER — LACTATED RINGERS IV SOLN
INTRAVENOUS | Status: DC | PRN
  Administered 2014-07-16 (×2): via INTRAVENOUS

## 2014-07-16 MED ORDER — NALBUPHINE HCL 10 MG/ML IJ SOLN: 5.0000 mg | Freq: Once | INTRAMUSCULAR | Status: AC | PRN

## 2014-07-16 MED ORDER — ACETAMINOPHEN 325 MG PO TABS: 650.0000 mg | ORAL_TABLET | ORAL | Status: DC | PRN

## 2014-07-16 MED ORDER — NALOXONE HCL 1 MG/ML IJ SOLN: 1.0000 ug/kg/h | INTRAVENOUS | Status: DC | PRN

## 2014-07-16 MED ORDER — DOCUSATE SODIUM 100 MG PO CAPS
100.0000 mg | ORAL_CAPSULE | Freq: Every day | ORAL | Status: DC
  Filled 2014-07-16: qty 1

## 2014-07-16 MED ORDER — WITCH HAZEL-GLYCERIN EX PADS: 1.0000 "application " | MEDICATED_PAD | CUTANEOUS | Status: DC | PRN

## 2014-07-16 MED ORDER — LIDOCAINE-EPINEPHRINE 2 %-1:100000 IJ SOLN
INTRAMUSCULAR | Status: DC | PRN
Start: 2014-07-16 — End: 2014-07-16

## 2014-07-16 MED ORDER — LACTATED RINGERS IV SOLN
INTRAVENOUS | Status: DC
  Administered 2014-07-16: 17:00:00 via INTRAVENOUS

## 2014-07-16 MED ORDER — IBUPROFEN 600 MG PO TABS
600.0000 mg | ORAL_TABLET | Freq: Four times a day (QID) | ORAL | Status: DC
  Administered 2014-07-16 – 2014-07-20 (×15): 600 mg via ORAL
  Filled 2014-07-16 (×15): qty 1

## 2014-07-16 MED ORDER — SCOPOLAMINE 1 MG/3DAYS TD PT72
MEDICATED_PATCH | TRANSDERMAL | Status: DC | PRN
  Administered 2014-07-16: 1 via TRANSDERMAL

## 2014-07-16 MED ORDER — CALCIUM CARBONATE ANTACID 500 MG PO CHEW
2.0000 | CHEWABLE_TABLET | ORAL | Status: DC | PRN
  Filled 2014-07-16: qty 2

## 2014-07-16 MED ORDER — ALBUTEROL SULFATE HFA 108 (90 BASE) MCG/ACT IN AERS: 2.0000 | INHALATION_SPRAY | Freq: Four times a day (QID) | RESPIRATORY_TRACT | Status: DC | PRN

## 2014-07-16 MED ORDER — OXYCODONE-ACETAMINOPHEN 5-325 MG PO TABS
1.0000 | ORAL_TABLET | ORAL | Status: DC | PRN
  Administered 2014-07-16: 1 via ORAL
  Administered 2014-07-17 (×3): 2 via ORAL
  Administered 2014-07-18 – 2014-07-20 (×5): 1 via ORAL
  Filled 2014-07-16 (×2): qty 1
  Filled 2014-07-16: qty 2
  Filled 2014-07-16: qty 1
  Filled 2014-07-16 (×3): qty 2
  Filled 2014-07-16 (×2): qty 1

## 2014-07-16 MED ORDER — ACETAMINOPHEN 325 MG PO TABS
650.0000 mg | ORAL_TABLET | ORAL | Status: DC | PRN
  Administered 2014-07-18: 650 mg via ORAL
  Filled 2014-07-16: qty 2

## 2014-07-16 MED ORDER — KETOROLAC TROMETHAMINE 30 MG/ML IJ SOLN: 30.0000 mg | Freq: Four times a day (QID) | INTRAMUSCULAR | Status: DC | PRN

## 2014-07-16 MED ORDER — OXYCODONE-ACETAMINOPHEN 5-325 MG PO TABS: 1.0000 | ORAL_TABLET | ORAL | Status: DC | PRN

## 2014-07-16 MED ORDER — OXYTOCIN 40 UNITS IN LACTATED RINGERS INFUSION - SIMPLE MED: 62.5000 mL/h | INTRAVENOUS | Status: DC

## 2014-07-16 MED ORDER — FLUTICASONE PROPIONATE HFA 44 MCG/ACT IN AERO
1.0000 | INHALATION_SPRAY | Freq: Every day | RESPIRATORY_TRACT | Status: DC
  Filled 2014-07-16: qty 10.6

## 2014-07-16 MED ORDER — MEASLES, MUMPS & RUBELLA VAC ~~LOC~~ INJ
0.5000 mL | INJECTION | Freq: Once | SUBCUTANEOUS | Status: DC
  Filled 2014-07-16: qty 0.5

## 2014-07-16 MED ORDER — DIPHENHYDRAMINE HCL 50 MG/ML IJ SOLN: 12.5000 mg | INTRAMUSCULAR | Status: DC | PRN

## 2014-07-16 MED ORDER — ZOLPIDEM TARTRATE 5 MG PO TABS: 5.0000 mg | ORAL_TABLET | Freq: Every evening | ORAL | Status: DC | PRN

## 2014-07-16 MED ORDER — OXYTOCIN BOLUS FROM INFUSION: 500.0000 mL | INTRAVENOUS | Status: DC

## 2014-07-16 MED ORDER — SCOPOLAMINE 1 MG/3DAYS TD PT72
MEDICATED_PATCH | TRANSDERMAL | Status: AC
  Filled 2014-07-16: qty 1

## 2014-07-16 MED ORDER — PHENYLEPHRINE HCL 10 MG/ML IJ SOLN
INTRAMUSCULAR | Status: DC | PRN
  Administered 2014-07-16 (×2): 80 ug via INTRAVENOUS

## 2014-07-16 MED ORDER — OXYTOCIN 10 UNIT/ML IJ SOLN
INTRAMUSCULAR | Status: AC
  Filled 2014-07-16: qty 4

## 2014-07-16 MED ORDER — LACTATED RINGERS IV SOLN
INTRAVENOUS | Status: DC
  Administered 2014-07-16: 04:00:00 via INTRAVENOUS

## 2014-07-16 MED ORDER — SIMETHICONE 80 MG PO CHEW: 80.0000 mg | CHEWABLE_TABLET | ORAL | Status: DC | PRN

## 2014-07-16 MED ORDER — MAGNESIUM HYDROXIDE 400 MG/5ML PO SUSP: 30.0000 mL | ORAL | Status: DC | PRN

## 2014-07-16 MED ORDER — FENTANYL CITRATE (PF) 100 MCG/2ML IJ SOLN
INTRAMUSCULAR | Status: AC
  Filled 2014-07-16: qty 2

## 2014-07-16 MED ORDER — PHENYLEPHRINE 8 MG IN D5W 100 ML (0.08MG/ML) PREMIX OPTIME
INJECTION | INTRAVENOUS | Status: AC
  Filled 2014-07-16: qty 100

## 2014-07-16 MED ORDER — LEVOTHYROXINE SODIUM 50 MCG PO TABS
50.0000 ug | ORAL_TABLET | Freq: Every day | ORAL | Status: DC
  Administered 2014-07-16 – 2014-07-20 (×5): 50 ug via ORAL
  Filled 2014-07-16 (×5): qty 1

## 2014-07-16 MED ORDER — OXYTOCIN 10 UNIT/ML IJ SOLN
40.0000 [IU] | INTRAMUSCULAR | Status: DC | PRN
  Administered 2014-07-16: 40 [IU] via INTRAVENOUS

## 2014-07-16 MED ORDER — DIPHENHYDRAMINE HCL 25 MG PO CAPS: 25.0000 mg | ORAL_CAPSULE | ORAL | Status: DC | PRN

## 2014-07-16 MED ORDER — MORPHINE SULFATE (PF) 0.5 MG/ML IJ SOLN
INTRAMUSCULAR | Status: DC | PRN
  Administered 2014-07-16: .1 mg via INTRATHECAL

## 2014-07-16 MED ORDER — DIPHENHYDRAMINE HCL 50 MG/ML IJ SOLN
INTRAMUSCULAR | Status: DC | PRN
  Administered 2014-07-16: 25 mg via INTRAVENOUS

## 2014-07-16 MED ORDER — PRENATAL MULTIVITAMIN CH
1.0000 | ORAL_TABLET | Freq: Every day | ORAL | Status: DC
  Filled 2014-07-16: qty 1

## 2014-07-16 MED ORDER — SENNOSIDES-DOCUSATE SODIUM 8.6-50 MG PO TABS
2.0000 | ORAL_TABLET | ORAL | Status: DC
  Administered 2014-07-16 – 2014-07-20 (×4): 2 via ORAL
  Filled 2014-07-16 (×4): qty 2

## 2014-07-16 MED ORDER — NALOXONE HCL 0.4 MG/ML IJ SOLN: 0.4000 mg | INTRAMUSCULAR | Status: DC | PRN

## 2014-07-16 MED ORDER — KETOROLAC TROMETHAMINE 30 MG/ML IJ SOLN
INTRAMUSCULAR | Status: AC
  Filled 2014-07-16: qty 1

## 2014-07-16 MED ORDER — SODIUM CHLORIDE 0.9 % IJ SOLN: 3.0000 mL | INTRAMUSCULAR | Status: DC | PRN

## 2014-07-16 MED ORDER — PHENYLEPHRINE 40 MCG/ML (10ML) SYRINGE FOR IV PUSH (FOR BLOOD PRESSURE SUPPORT)
PREFILLED_SYRINGE | INTRAVENOUS | Status: AC
  Filled 2014-07-16: qty 10

## 2014-07-16 MED ORDER — LIDOCAINE HCL (PF) 1 % IJ SOLN: 30.0000 mL | INTRAMUSCULAR | Status: DC | PRN

## 2014-07-16 MED ORDER — OXYCODONE-ACETAMINOPHEN 5-325 MG PO TABS: 2.0000 | ORAL_TABLET | ORAL | Status: DC | PRN

## 2014-07-16 SURGICAL SUPPLY — 35 items
BENZOIN TINCTURE PRP APPL 2/3 (GAUZE/BANDAGES/DRESSINGS) ×3 IMPLANT
CLAMP CORD UMBIL (MISCELLANEOUS) IMPLANT
CLOSURE WOUND 1/2 X4 (GAUZE/BANDAGES/DRESSINGS) ×1
CLOTH BEACON ORANGE TIMEOUT ST (SAFETY) ×3 IMPLANT
CONTAINER PREFILL 10% NBF 15ML (MISCELLANEOUS) IMPLANT
DRAPE SHEET LG 3/4 BI-LAMINATE (DRAPES) IMPLANT
DRSG OPSITE POSTOP 4X10 (GAUZE/BANDAGES/DRESSINGS) ×3 IMPLANT
DRSG TELFA 3X8 NADH (GAUZE/BANDAGES/DRESSINGS) ×3 IMPLANT
DURAPREP 26ML APPLICATOR (WOUND CARE) ×3 IMPLANT
ELECT REM PT RETURN 9FT ADLT (ELECTROSURGICAL) ×3
ELECTRODE REM PT RTRN 9FT ADLT (ELECTROSURGICAL) ×1 IMPLANT
EXTRACTOR VACUUM KIWI (MISCELLANEOUS) ×3 IMPLANT
EXTRACTOR VACUUM M CUP 4 TUBE (SUCTIONS) ×2 IMPLANT
EXTRACTOR VACUUM M CUP 4' TUBE (SUCTIONS) ×1
GLOVE ORTHO TXT STRL SZ7.5 (GLOVE) ×3 IMPLANT
GOWN STRL REUS W/TWL LRG LVL3 (GOWN DISPOSABLE) ×6 IMPLANT
KIT ABG SYR 3ML LUER SLIP (SYRINGE) IMPLANT
NEEDLE HYPO 25X5/8 SAFETYGLIDE (NEEDLE) ×3 IMPLANT
NS IRRIG 1000ML POUR BTL (IV SOLUTION) ×3 IMPLANT
PACK C SECTION WH (CUSTOM PROCEDURE TRAY) ×3 IMPLANT
PAD OB MATERNITY 4.3X12.25 (PERSONAL CARE ITEMS) ×3 IMPLANT
RTRCTR C-SECT PINK 25CM LRG (MISCELLANEOUS) ×3 IMPLANT
STAPLER VISISTAT 35W (STAPLE) IMPLANT
STRIP CLOSURE SKIN 1/2X4 (GAUZE/BANDAGES/DRESSINGS) ×2 IMPLANT
SUT CHROMIC 1 CTX 36 (SUTURE) ×6 IMPLANT
SUT PLAIN 0 NONE (SUTURE) ×3 IMPLANT
SUT PLAIN 2 0 XLH (SUTURE) ×3 IMPLANT
SUT VIC AB 0 CT1 27 (SUTURE) ×4
SUT VIC AB 0 CT1 27XBRD ANBCTR (SUTURE) ×2 IMPLANT
SUT VIC AB 2-0 CT1 (SUTURE) ×3 IMPLANT
SUT VIC AB 2-0 CT1 27 (SUTURE) ×2
SUT VIC AB 2-0 CT1 TAPERPNT 27 (SUTURE) ×1 IMPLANT
SUT VIC AB 4-0 KS 27 (SUTURE) ×3 IMPLANT
TOWEL OR 17X24 6PK STRL BLUE (TOWEL DISPOSABLE) ×3 IMPLANT
TRAY FOLEY CATH SILVER 14FR (SET/KITS/TRAYS/PACK) ×3 IMPLANT

## 2014-07-16 NOTE — Addendum Note (Signed)
Addendum  created 07/16/14 1808 by Renford DillsJanet L Angelina Neece, CRNA   Modules edited: Notes Section   Notes Section:  File: [id number]

## 2014-07-16 NOTE — H&P (Signed)
History     CSN: [id number]  Arrival date and time: 07/15/14 2339  First Provider Initiated Contact with Patient 07/16/14 0013    Chief Complaint  Patient presents with  . Decreased Fetal Movement   HPI  Melissa Mckay is a 34 y.o. G2P1001 at 241w0d here with report of decreased fetal movement since Thursday. Reports feeling a lot more movement in the past however it has decreased in past two days. Last noticeable movement was on Thursday. Pt states has tried to drink something and laid on side, however no movement was felt. Denies vaginal bleeding, leaking of fluid, or contractions. Denies any problems this pregnancy.   Past Medical History  Diagnosis Date  . Asthma   . Hypothyroidism   . Chlamydia   . Genital HSV   . Normal pregnancy 09/10/2010  . Pregnancy induced hypertension 2012    preE G1    Past Surgical History  Procedure Laterality Date  . 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 Cancer Mother   . Diabetes Mother   . Anemia    . COPD    . Depression    . Diabetes type II    . Fibromyalgia    . Hypertension    . Thyroid disease    . Diabetes Father     History  Substance Use Topics  . Smoking status: Former Games developermoker  . Smokeless tobacco: Not on file  . Alcohol Use: No    Allergies:  Allergies  Allergen Reactions  . Sulfa Antibiotics Hives and Shortness Of Breath    Prescriptions prior to admission  Medication Sig Dispense Refill Last Dose  . acetaminophen (TYLENOL) 500 MG tablet Take 1,000 mg by mouth every 6 (six) hours as needed for mild pain.   Past Month at Unknown time  . albuterol (PROVENTIL HFA;VENTOLIN HFA) 108 (90 BASE) MCG/ACT inhaler Inhale 2 puffs into the lungs every 6 (six) hours as needed for wheezing or  shortness of breath.   Past Month at Unknown time  . ALBUTEROL IN Inhale 2 puffs into the lungs daily. As needed for wheezing/athsma   07/15/2014 at Unknown time  . beclomethasone (QVAR) 40 MCG/ACT inhaler Inhale 2 puffs into the lungs 2 (two) times daily.   Past Month at Unknown time  . cetirizine (ZYRTEC) 10 MG tablet Take 10 mg by mouth daily.   Past Week at Unknown time  . levothyroxine (SYNTHROID, LEVOTHROID) 50 MCG tablet Take 50 mcg by mouth daily.    07/14/2014 at Unknown time  . montelukast (SINGULAIR) 10 MG tablet Take 10 mg by mouth daily as needed. As needed for allergies   07/14/2014 at Unknown time  . acetaminophen-codeine (TYLENOL #3) 300-30 MG per tablet Take 1-2 tablets by mouth every 6 (six) hours as needed for moderate pain. (Patient not taking: Reported on 05/20/2014) 15 tablet 0   . azithromycin (ZITHROMAX Z-PAK) 250 MG tablet Take 1 tablet (250 mg total) by mouth daily. (Patient not taking: Reported on 05/20/2014) 6 tablet 0 Unknown at Unknown time  . benzocaine-Menthol (DERMOPLAST) 20-0.5 % AERO Apply 1 application topically 2 (two) times daily as needed (perineal discomfort). (Patient not taking: Reported on 05/20/2014)   Unknown at Unknown time  . cefdinir (OMNICEF) 125 MG/5ML suspension Take by mouth 2 (two) times daily.   04/03/2014 at Unknown time  . flintstones complete (FLINTSTONES) 60 MG chewable tablet  Chew 2 tablets by mouth daily.    More than a month at Unknown time  . fluticasone (FLONASE) 50 MCG/ACT nasal spray Place 1-2 sprays into the nose daily as needed for allergies.    More than a month at Unknown time  . fluticasone (FLOVENT HFA) 44 MCG/ACT inhaler Inhale 1 puff into the lungs daily. Currently using in place of Asmanex    More than a month at Unknown time  . ipratropium (ATROVENT) 0.06 % nasal spray Place 2 sprays into both nostrils 4 (four) times daily. (Patient not taking: Reported on  05/20/2014) 15 mL 1   . Mometasone Furoate (ASMANEX 30 METERED DOSES) 110 MCG/INH AEPB Inhale 1 puff into the lungs every evening. Patient takes for allergies and athsma   04/03/2014 at Unknown time  . Multiple Vitamin (MULTIVITAMIN) tablet Take 1 tablet by mouth daily.   More than a month at Unknown time  . predniSONE (DELTASONE) 20 MG tablet Take 3 tablets (60 mg total) by mouth daily. 15 tablet 0   . prenatal vitamin w/FE, FA (PRENATAL 1 + 1) 27-1 MG TABS Take 1 tablet by mouth daily.    More than a month at Unknown time    Review of Systems  Constitutional: Negative for fever and chills.  Respiratory: Positive for cough and sputum production (clear). Negative for hemoptysis.  Gastrointestinal: Positive for nausea. Negative for vomiting.  Genitourinary: Negative for dysuria, urgency and hematuria.   Pelvic pressure  Neurological: Negative for headaches.  All other systems reviewed and are negative.  Physical Exam   Blood pressure 132/82, pulse 91, temperature 98.7 F (37.1 C), temperature source Oral, resp. rate 18, height 5\' 4"  (1.626 m), weight 118.842 kg (262 lb), last menstrual period 12/04/2013, SpO2 100 %.  Physical Exam  Constitutional: She is oriented to person, place, and time. She appears well-developed and well-nourished. No distress.  HENT:  Head: Normocephalic.  Neck: Normal range of motion. Neck supple.  Cardiovascular: Normal rate, regular rhythm and normal heart sounds.  Respiratory: Effort normal and breath sounds normal.  GI: Soft. There is no tenderness.  Genitourinary: Guaiac stool: No bleeding in the vagina.  Neurological: She is alert and oriented to person, place, and time.  Skin: Skin is warm and dry.   FHR 140's, min variability, late decels, no accels, no response to IV fluid, position change and oxygen Toco - occ     A/P:  IUP at 32 weeks with Cat III FHT  unresponsive to resuscitative measures, normal labs, no  source for abnormal FHT identified.  Discussed with pt, recommended delivery by c-section.  C-section procedure and risks discussed, pt agrees to proceed.

## 2014-07-16 NOTE — Progress Notes (Signed)
Reported that fetal heart rate is 150 with minimal variability, no accelerations with late decelerations. Having occasional mild UCs. Given 1 liter of fluid, position changes. Ordered to add oxygen and call back at 430am with update on the fetal heart rate

## 2014-07-16 NOTE — Anesthesia Preprocedure Evaluation (Signed)
Anesthesia Evaluation  Patient identified by MRN, date of birth, ID band Patient awake    Reviewed: Allergy & Precautions, H&P , NPO status , Patient's Chart, lab work & pertinent test results  History of Anesthesia Complications Negative for: history of anesthetic complications  Airway Mallampati: II  TM Distance: >3 FB Neck ROM: full    Dental no notable dental hx. (+) Teeth Intact   Pulmonary neg pulmonary ROS, asthma , former smoker,  breath sounds clear to auscultation  Pulmonary exam normal       Cardiovascular hypertension, negative cardio ROS Normal cardiovascular examRhythm:regular Rate:Normal     Neuro/Psych negative neurological ROS  negative psych ROS   GI/Hepatic negative GI ROS, Neg liver ROS,   Endo/Other  negative endocrine ROSMorbid obesity  Renal/GU negative Renal ROS  negative genitourinary   Musculoskeletal   Abdominal   Peds  Hematology negative hematology ROS (+)   Anesthesia Other Findings   Reproductive/Obstetrics (+) Pregnancy                             Anesthesia Physical Anesthesia Plan  ASA: II  Anesthesia Plan: Spinal   Post-op Pain Management:    Induction:   Airway Management Planned:   Additional Equipment:   Intra-op Plan:   Post-operative Plan:   Informed Consent: I have reviewed the patients History and Physical, chart, labs and discussed the procedure including the risks, benefits and alternatives for the proposed anesthesia with the patient or authorized representative who has indicated his/her understanding and acceptance.     Plan Discussed with:   Anesthesia Plan Comments:         Anesthesia Quick Evaluation

## 2014-07-16 NOTE — Anesthesia Procedure Notes (Signed)
Spinal Patient location during procedure: OR Staffing Anesthesiologist: Montez Hageman Performed by: anesthesiologist  Preanesthetic Checklist Completed: patient identified, site marked, surgical consent, pre-op evaluation, timeout performed, IV checked, risks and benefits discussed and monitors and equipment checked Spinal Block Patient position: sitting Prep: DuraPrep Patient monitoring: heart rate, continuous pulse ox and blood pressure Approach: right paramedian Location: L4-5 Injection technique: single-shot Needle Needle type: Sprotte  Needle gauge: 24 G Needle length: 9 cm Needle insertion depth: 9 cm Additional Notes Expiration date of kit checked and confirmed. Patient tolerated procedure well, without complications.

## 2014-07-16 NOTE — Progress Notes (Signed)
FHT- Cat II, min-mod variability, no accels, some decels-possibly lates BPP 6/8 Will admit, hydrate, check labs, give steroids, consider delivery if FHT does not improve in the next couple of hours.

## 2014-07-16 NOTE — Anesthesia Postprocedure Evaluation (Signed)
  Anesthesia Post-op Note  Patient: Melissa Mckay  Procedure(s) Performed: Procedure(s): CESAREAN SECTION (N/A)  Patient Location: [facility]  Anesthesia Type:Spinal  Level of Consciousness: awake  Airway and Oxygen Therapy: Patient Spontanous Breathing  Post-op Pain: mild  Post-op Assessment: Patient's Cardiovascular Status Stable and Respiratory Function Stable  Post-op Vital Signs: stable  Last Vitals:  Filed Vitals:   07/16/14 1255  BP: 118/69  Pulse: 80  Temp: 36.6 C  Resp: 18    Complications: No apparent anesthesia complications

## 2014-07-16 NOTE — Anesthesia Postprocedure Evaluation (Signed)
  Anesthesia Post-op Note  Patient: Melissa LackJennifer Berkovich  Procedure(s) Performed: Procedure(s): CESAREAN SECTION (N/A)  Patient is awake, responsive, moving her legs, and has signs of resolution of her numbness. Pain and nausea are reasonably well controlled. Vital signs are stable and clinically acceptable. Oxygen saturation is clinically acceptable. There are no apparent anesthetic complications at this time. Patient is ready for discharge.

## 2014-07-16 NOTE — Transfer of Care (Signed)
Immediate Anesthesia Transfer of Care Note  Patient: Melissa LackJennifer Mckay  Procedure(s) Performed: Procedure(s): CESAREAN SECTION (N/A)  Patient Location: PACU  Anesthesia Type:Spinal  Level of Consciousness: awake, alert  and oriented  Airway & Oxygen Therapy: Patient Spontanous Breathing  Post-op Assessment: Report given to RN and Post -op Vital signs reviewed and stable  Post vital signs: Reviewed and stable  Last Vitals:  Filed Vitals:   07/16/14 0407  BP:   Pulse:   Temp:   Resp: 18    Complications: No apparent anesthesia complications

## 2014-07-16 NOTE — MAU Provider Note (Signed)
History     CSN: [id number]  Arrival date and time: 07/15/14 2339   First Provider Initiated Contact with Patient 07/16/14 0013      Chief Complaint  Patient presents with  . Decreased Fetal Movement   HPI  [name] is a 34 y.o. G2P1001 at 5466w0d here with report of  decreased fetal movement since Thursday.  Reports feeling a lot more movement in the past however it has decreased in past two days.  Last noticeable movement was on Thursday.  Pt states has tried to drink something and laid on side, however no movement was felt.  Denies vaginal bleeding, leaking of fluid, or contractions.  Denies any problems this pregnancy.     Past Medical History  Diagnosis Date  . Asthma   . Hypothyroidism   . Chlamydia   . Genital HSV   . Normal pregnancy 09/10/2010  . Pregnancy induced hypertension 2012    preE G1    Past Surgical History  Procedure Laterality Date  . 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 Cancer Mother   . Diabetes Mother   . Anemia    . COPD    . Depression    . Diabetes type II    . Fibromyalgia    . Hypertension    . Thyroid disease    . Diabetes Father     History  Substance Use Topics  . Smoking status: Former Games developermoker  . Smokeless tobacco: Not on file  . Alcohol Use: No    Allergies:  Allergies  Allergen Reactions  . Sulfa Antibiotics Hives and Shortness Of Breath    Prescriptions prior to admission  Medication Sig Dispense Refill Last Dose  . acetaminophen (TYLENOL) 500 MG tablet Take 1,000 mg by mouth every 6 (six) hours as needed for mild pain.   Past Month at Unknown time  . albuterol (PROVENTIL HFA;VENTOLIN HFA) 108 (90 BASE) MCG/ACT inhaler Inhale 2 puffs into the lungs every 6 (six) hours as needed for wheezing or shortness of breath.   Past Month at Unknown time  . ALBUTEROL IN Inhale 2 puffs into the lungs daily. As needed for wheezing/athsma    07/15/2014 at Unknown time  . beclomethasone (QVAR) 40 MCG/ACT inhaler Inhale 2 puffs into the lungs 2 (two) times daily.   Past Month at Unknown time  . cetirizine (ZYRTEC) 10 MG tablet Take 10 mg by mouth daily.   Past Week at Unknown time  . levothyroxine (SYNTHROID, LEVOTHROID) 50 MCG tablet Take 50 mcg by mouth daily.    07/14/2014 at Unknown time  . montelukast (SINGULAIR) 10 MG tablet Take 10 mg by mouth daily as needed. As needed for allergies   07/14/2014 at Unknown time  . acetaminophen-codeine (TYLENOL #3) 300-30 MG per tablet Take 1-2 tablets by mouth every 6 (six) hours as needed for moderate pain. (Patient not taking: Reported on 05/20/2014) 15 tablet 0   . azithromycin (ZITHROMAX Z-PAK) 250 MG tablet Take 1 tablet (250 mg total) by mouth daily. (Patient not taking: Reported on 05/20/2014) 6 tablet 0 Unknown at Unknown time  . benzocaine-Menthol (DERMOPLAST) 20-0.5 % AERO Apply 1 application topically 2 (two) times daily as needed (perineal discomfort). (Patient not taking: Reported on 05/20/2014)   Unknown at Unknown time  . cefdinir (OMNICEF) 125 MG/5ML suspension Take by mouth 2 (two) times daily.   04/03/2014 at  Unknown time  . flintstones complete (FLINTSTONES) 60 MG chewable tablet Chew 2 tablets by mouth daily.     More than a month at Unknown time  . fluticasone (FLONASE) 50 MCG/ACT nasal spray Place 1-2 sprays into the nose daily as needed for allergies.    More than a month at Unknown time  . fluticasone (FLOVENT HFA) 44 MCG/ACT inhaler Inhale 1 puff into the lungs daily. Currently using in place of Asmanex    More than a month at Unknown time  . ipratropium (ATROVENT) 0.06 % nasal spray Place 2 sprays into both nostrils 4 (four) times daily. (Patient not taking: Reported on 05/20/2014) 15 mL 1   . Mometasone Furoate (ASMANEX 30 METERED DOSES) 110 MCG/INH AEPB Inhale 1 puff into the lungs every evening. Patient takes for allergies and athsma   04/03/2014 at Unknown time  . Multiple Vitamin  (MULTIVITAMIN) tablet Take 1 tablet by mouth daily.   More than a month at Unknown time  . predniSONE (DELTASONE) 20 MG tablet Take 3 tablets (60 mg total) by mouth daily. 15 tablet 0   . prenatal vitamin w/FE, FA (PRENATAL 1 + 1) 27-1 MG TABS Take 1 tablet by mouth daily.     More than a month at Unknown time    Review of Systems  Constitutional: Negative for fever and chills.  Respiratory: Positive for cough and sputum production (clear). Negative for hemoptysis.   Gastrointestinal: Positive for nausea. Negative for vomiting.  Genitourinary: Negative for dysuria, urgency and hematuria.       Pelvic pressure  Neurological: Negative for headaches.  All other systems reviewed and are negative.  Physical Exam   Blood pressure 132/82, pulse 91, temperature 98.7 F (37.1 C), temperature source Oral, resp. rate 18, height 5\' 4"  (1.626 m), weight 118.842 kg (262 lb), last menstrual period 12/04/2013, SpO2 100 %.  Physical Exam  Constitutional: She is oriented to person, place, and time. She appears well-developed and well-nourished. No distress.  HENT:  Head: Normocephalic.  Neck: Normal range of motion. Neck supple.  Cardiovascular: Normal rate, regular rhythm and normal heart sounds.   Respiratory: Effort normal and breath sounds normal.  GI: Soft. There is no tenderness.  Genitourinary: Guaiac stool:   No bleeding in the vagina.  Neurological: She is alert and oriented to person, place, and time.  Skin: Skin is warm and dry.   FHR 140's, +decelerations, min variability Toco - one MAU Course  Procedures  0030 Dr. Jackelyn KnifeMeisinger notified regarding two decelerations in fetal heart rate  > obtain BPP 0210 Dr. Jackelyn KnifeMeisinger notified regarding BPP 6/8 and continued decels > plans to review record and develop a management plan.    Eino FarberWalidah Kennith GainN Karim, CNM  Assessment and Plan

## 2014-07-16 NOTE — Op Note (Addendum)
Preoperative diagnosis: Intrauterine pregnancy at 32 weeks, Category III FHT Postoperative diagnosis: Same Procedure: Primary low transverse cesarean section without extensions Surgeon: Lavina Hammanodd Elin Seats M.D. Anesthesia: Spinal  Findings: Patient had normal gravid anatomy and delivered a viable female infant with Apgars of 2 and 7 weighed 2 lbs 14 oz.  Art cord pH 7.08. Estimated blood loss: 800 cc Specimens: Placenta sent to labor and delivery Complications: None  Procedure in detail:  The patient was taken to the operating room and placed in the sitting position. The anesthesiologist instilled spinal anesthesia.  She was then placed in the dorsosupine position with left tilt. Abdomen was then prepped and draped in the usual sterile fashion, and a foley catheter was inserted. The level of her anesthesia was found to be adequate. Abdomen was entered via a standard Pfannenstiel incision. Once the peritoneal cavity was entered the Alexis disposable self-retaining retractor was placed and good visualization was achieved. A 4 cm transverse incision was then made in the lower uterine segment pushing the bladder inferior. Once the uterine cavity was entered the incision was extended digitally. The fetal vertex was grasped and delivered through the incision atraumatically. Mouth and nares were suctioned. The remainder of the infant then delivered atraumatically. Cord was doubly clamped and cut and the infant handed to the awaiting pediatric team. Cord blood and arterial cord pH were obtained. The placenta delivered spontaneously. Uterus was wiped dry with clean lap pad and all clots and debris were removed. Uterine incision was inspected and found to be free of extensions. Uterine incision was closed in 2 layer with running #1 Chromic. Tubes and ovaries were inspected and found to be normal. Uterine incision was inspected and found to be hemostatic. Bleeding from serosal edges was controlled with electrocautery.  The Alexis retractor was removed. Subfascial space was irrigated and made hemostatic with electrocautery. Peritoneum was closed with 2-0 Vicryl.  Fascia was closed in running fashion starting at both ends and meeting in the middle with 0 Vicryl. Subcutaneous tissue was then irrigated and made hemostatic with electrocautery, then closed with running 2-0 plain gut. Skin was closed with running 4-0 Vicryl subcuticular suture followed by steri-strips and a sterile dressing. Patient tolerated the procedure well and was taken to the recovery in stable condition. Counts were correct x2, she received Ancef 2 g IV at the beginning of the procedure and she had PAS hose on throughout the procedure.

## 2014-07-17 ENCOUNTER — Encounter (HOSPITAL_COMMUNITY): Payer: Self-pay | Admitting: Obstetrics and Gynecology

## 2014-07-17 LAB — CBC
HCT: 32.6 % — ABNORMAL LOW (ref 36.0–46.0)
HEMOGLOBIN: 11 g/dL — AB (ref 12.0–15.0)
MCH: 28.9 pg (ref 26.0–34.0)
MCHC: 33.7 g/dL (ref 30.0–36.0)
MCV: 85.6 fL (ref 78.0–100.0)
PLATELETS: 189 10*3/uL (ref 150–400)
RBC: 3.81 MIL/uL — ABNORMAL LOW (ref 3.87–5.11)
RDW: 15.4 % (ref 11.5–15.5)
WBC: 12 10*3/uL — AB (ref 4.0–10.5)

## 2014-07-17 LAB — RPR: RPR Ser Ql: NONREACTIVE

## 2014-07-17 MED ORDER — RHO D IMMUNE GLOBULIN 1500 UNIT/2ML IJ SOSY
300.0000 ug | PREFILLED_SYRINGE | Freq: Once | INTRAMUSCULAR | Status: AC
  Administered 2014-07-17: 300 ug via INTRAMUSCULAR
  Filled 2014-07-17: qty 2

## 2014-07-17 NOTE — Progress Notes (Signed)
Subjective: Postpartum Day #1: Cesarean Delivery Patient reports incisional pain, tolerating PO and no problems voiding.  Baby stable in NICU.  Objective: Vital signs in last 24 hours: Temp:  [97.7 F (36.5 C)-98.4 F (36.9 C)] 98 F (36.7 C) (05/17 0146) Pulse Rate:  [64-87] 72 (05/17 0146) Resp:  [16-20] 18 (05/17 0146) BP: (110-125)/(49-71) 110/49 mmHg (05/17 0146) SpO2:  [96 %-100 %] 97 % (05/17 0146)  Physical Exam:  General: alert Lochia: appropriate Uterine Fundus: firm Incision: dressing C/D/I   Recent Labs  07/16/14 0215 07/17/14 0527  HGB 13.4 11.0*  HCT 38.7 32.6*    Assessment/Plan: Status post Cesarean section. Doing well postoperatively.  Continue current care, ambulate.  Kole Hilyard D 07/17/2014, 8:25 AM

## 2014-07-18 LAB — RH IG WORKUP (INCLUDES ABO/RH)
ABO/RH(D): A NEG
FETAL SCREEN: NEGATIVE
GESTATIONAL AGE(WKS): 32
Unit division: 0

## 2014-07-18 NOTE — Progress Notes (Signed)
POD #2 Doing ok, sore, baby stable in NICU Afeb, VSS Abd- fundus firm, incision intact Continue ambulation and routine care

## 2014-07-19 NOTE — Lactation Note (Signed)
This note was copied from the chart of Melissa Damita LackJennifer Panico. Lactation Consultation Note    Initial consult weth this mom of a NICU baby, now 8882 hours old, and 32 3/7 weeks CGA. Mom reports she has had a breast reduction in the past, 10 years ago, and is not pumping due to this. She has tried hand expression, and is not expresing any colostrum. I told her I was willing to help with some hand expression if she wanted, to have her nurse call me.   Patient Name: Melissa Mckay RUEAV'WToday's Date: 07/19/2014     Maternal Data    Feeding Feeding Type: Donor Breast Milk Length of feed: 30 min  LATCH Score/Interventions                      Lactation Tools Discussed/Used     Consult Status      Melissa Mckay, Melissa Mckay 07/19/2014, 4:07 PM

## 2014-07-19 NOTE — Progress Notes (Signed)
POD #3 Doing ok, baby having TORCH titers checked Afeb, VSS Abd- soft, fundus firm, incision intact Continue routine care, she wants to stay today because of baby, will d/c tomorrow

## 2014-07-20 LAB — TYPE AND SCREEN
ABO/RH(D): A NEG
Antibody Screen: POSITIVE
DAT, IGG: NEGATIVE
UNIT DIVISION: 0
UNIT DIVISION: 0

## 2014-07-20 MED ORDER — OXYCODONE-ACETAMINOPHEN 5-325 MG PO TABS: 1.0000 | ORAL_TABLET | ORAL | Status: DC | PRN

## 2014-07-20 MED ORDER — IBUPROFEN 600 MG PO TABS: 600.0000 mg | ORAL_TABLET | Freq: Four times a day (QID) | ORAL | Status: DC

## 2014-07-20 NOTE — Discharge Instructions (Signed)
As per discharge pamphlet °

## 2014-07-20 NOTE — Progress Notes (Signed)
Pt discharged home with husband... Discharge instructions reviewed with pt and she verbalized understanding... Condition stable... No equipment... Ambulated to car with Verdie DrownJ. [place], NT.

## 2014-07-20 NOTE — Clinical Social Work Maternal (Signed)
CLINICAL SOCIAL WORK MATERNAL/CHILD NOTE  Patient Details  Name: Melissa Mckay MRN: [id number] Date of Birth: [birth year]  Date:  07/20/2014  Clinical Social Worker Initiating Note:  Holten Spano E. 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  Strengths:  Ability to meet basic needs , Compliance with medical plan , Home prepared for child , Pediatrician chosen , Understanding of illness (Pediatric follow up will be at [facility])   Risk Factors/Current Problems:  None   Cognitive State:  Alert , Insightful , Goal Oriented , Linear Thinking    Mood/Affect:  Calm , Comfortable , Interested , Relaxed    CSW Assessment: CSW met with parents in MOB's third floor room/304 to introduce myself, complete assessment and offer support due to baby's admission to NICU at 32 weeks.  Parents were pleasant and welcoming of CSW's visit.  They report doing well and seem to have a good understanding of baby's medical needs at this time and were able to provide CSW with an update today.   MOB was very talkative, and seemed appreciate of the opportunity to  discuss her feelings surrounding her daughter's premature birth.  FOB was engaged at times, but apologized when he was not, as he was trying to update many people before they went home today.  CSW informed them of "[facility]" as a potential resource for this.  MOB talked about her pregnancy and how things were going fine until the other day when she sensed decreased fetal movement and came in to be monitored.  She states it was about 4 hours later that she was told she was going to be having a baby.  She reports coping well and thankful that baby came out when she did since she was in distress.  CSW acknowledged her strong maternal instincts.  Parents report feeling comfortable with the care baby is receiving and no questions or concerns at this time.  They report having a great support system through their family and church family.  They have one other child, who is being cared for by family while they have been in the hospital.  MOB states FOB went home each night to be with him.  CSW discussed balancing home and hospital with a child at each and encouraged parents to be gentle with themselves as they adjust to parenting two children in two different locations.  CSW encouraged parents to allow themselves to be emotional and to try not to have expectations on when baby will be discharged, as this often sets  parents up for letdown when expectations are not met.  Parents were very understanding.   CSW inquired about MOB's postpartum period after her first child.  She states she thinks she did well, but acknowledges heightened anxiety when around other people with her baby.  CSW reviewed signs and symptoms of PPD, which MOB was very open to.  FOB was attentive as well.  MOB commits to being open and talking with her doctor and or CSW if she has concerns about her emotions at any time.   Parents seemed appreciative of CSW's visit and concern for their emotional wellbeing.  CSW provided contact information and will  follow up as needed/desired by family.  CSW Plan/Description:  Engineer, mining , Psychosocial Support and Ongoing Assessment of Needs    Alphonzo Cruise, [place] 07/20/2014, 3:24 PM

## 2014-07-20 NOTE — Progress Notes (Signed)
Patient ID: Melissa Mckay, female   DOB: 03/16/1980, 34 y.o.   MRN: [id number] POD #4 Doing ok, baby stable-possible CMV Afeb, VSS\ Abd- soft, fundus firm, incision intact D/c home

## 2014-07-20 NOTE — Discharge Summary (Signed)
Obstetric Discharge Summary Reason for Admission: NRFHT with decreased fetal movement Prenatal Procedures: none Intrapartum Procedures: cesarean: low cervical, transverse Postpartum Procedures: none Complications-Operative and Postpartum: none HEMOGLOBIN  Date Value Ref Range Status  07/17/2014 11.0* 12.0 - 15.0 g/dL Final   HCT  Date Value Ref Range Status  07/17/2014 32.6* 36.0 - 46.0 % Final    Physical Exam:  General: alert Lochia: appropriate Uterine Fundus: firm Incision: healing well   Discharge Diagnoses: IUP at 32 weeks, decreased fetal movement, NRFHT, possible fetal CMV  Discharge Information: Date: 07/20/2014 Activity: pelvic rest and no strenuous activity Diet: routine Medications: Ibuprofen and Percocet Condition: stable Instructions: refer to practice specific booklet Discharge to: home Follow-up Information    Follow up with Brinson Tozzi D, MD. Schedule an appointment as soon as possible for a visit in 2 weeks.   Specialty:  Obstetrics and Gynecology   Contact information:   [address], [address] [place] [id number] [phone]       Newborn Data: Live born female  Birth Weight: 2 lb 14.9 oz (1330 g) APGAR: 2, 7   Still in NICU.  Demara Lover D 07/20/2014, 7:17 AM

## 2015-03-03 HISTORY — PX: CHOLECYSTECTOMY: SHX55

## 2015-09-20 ENCOUNTER — Ambulatory Visit (INDEPENDENT_AMBULATORY_CARE_PROVIDER_SITE_OTHER): Payer: BLUE CROSS/BLUE SHIELD | Admitting: Internal Medicine

## 2015-09-20 ENCOUNTER — Encounter: Payer: Self-pay | Admitting: Internal Medicine

## 2015-09-20 VITALS — BP 142/90 | HR 69 | Ht 63.5 in | Wt 250.0 lb

## 2015-09-20 DIAGNOSIS — E039 Hypothyroidism, unspecified: Secondary | ICD-10-CM

## 2015-09-20 DIAGNOSIS — R5382 Chronic fatigue, unspecified: Secondary | ICD-10-CM

## 2015-09-20 LAB — LIPID PANEL
Cholesterol: 174 mg/dL (ref 0–200)
HDL: 52.7 mg/dL (ref >39.00)
LDL Cholesterol: 103 mg/dL — ABNORMAL HIGH (ref 0–99)
NonHDL: 121.19
Total CHOL/HDL Ratio: 3
Triglycerides: 89 mg/dL (ref 0.0–149.0)
VLDL: 17.8 mg/dL (ref 0.0–40.0)

## 2015-09-20 LAB — COMPLETE METABOLIC PANEL WITH GFR
ALBUMIN: 4.1 g/dL (ref 3.6–5.1)
ALT: 48 U/L — ABNORMAL HIGH (ref 6–29)
AST: 32 U/L — AB (ref 10–30)
Alkaline Phosphatase: 60 U/L (ref 33–115)
BUN: 9 mg/dL (ref 7–25)
CALCIUM: 8.8 mg/dL (ref 8.6–10.2)
CO2: 26 mmol/L (ref 20–31)
Chloride: 103 mmol/L (ref 98–110)
Creat: 0.8 mg/dL (ref 0.50–1.10)
GFR, Est African American: >89 mL/min (ref >=60)
GLUCOSE: 77 mg/dL (ref 65–99)
Potassium: 3.9 mmol/L (ref 3.5–5.3)
Sodium: 140 mmol/L (ref 135–146)
Total Bilirubin: 0.7 mg/dL (ref 0.2–1.2)
Total Protein: 7.1 g/dL (ref 6.1–8.1)

## 2015-09-20 LAB — T3, FREE: T3 FREE: 2.7 pg/mL (ref 2.3–4.2)

## 2015-09-20 LAB — VITAMIN D 25 HYDROXY (VIT D DEFICIENCY, FRACTURES): VITD: 26.46 ng/mL — AB (ref 30.00–100.00)

## 2015-09-20 LAB — TSH: TSH: 2.22 u[IU]/mL (ref 0.35–4.50)

## 2015-09-20 LAB — HEMOGLOBIN A1C: HEMOGLOBIN A1C: 5.4 % (ref 4.6–6.5)

## 2015-09-20 LAB — T4, FREE: Free T4: 0.69 ng/dL (ref 0.60–1.60)

## 2015-09-20 LAB — VITAMIN B12: Vitamin B-12: 282 pg/mL (ref 211–911)

## 2015-09-20 NOTE — Patient Instructions (Signed)
Please stop at the lab.  Please come back for a follow-up appointment in 4 months  Please consider the following ways to cut down carbs and fat and increase fiber and micronutrients in your diet: - substitute whole grain for white bread or pasta - substitute brown rice for white rice - substitute 90-calorie flatbread pieces for slices of bread when possible - substitute sweet potatoes or yams for white potatoes - substitute humus for margarine - substitute tofu for cheese when possible - substitute almond or rice milk for regular milk - substitute dark chocolate for other sweets when possible - substitute water - can add lemon/orange/lime/kiwi slices for taste - for diet sodas (artificial sweeteners will trick your body that you can eat sweets without getting calories and will lead you to overeating and weight gain in the long run) - do not skip breakfast or other meals (this will slow down the metabolism and will result in more weight gain over time)  - can try smoothies made from fruit and almond/rice milk in am instead of regular breakfast - can also try old-fashioned (not instant) oatmeal made with almond/rice milk in am - order the dressing on the side when eating salad at a restaurant (pour less than half of the dressing on the salad) - eat as little meat as possible  - can try juicing, but should not forget that juicing will get rid of the fiber, so would alternate with eating raw veg./fruits or drinking smoothies - use as little oil as possible, even when using olive oil - can dress a salad with a mix of balsamic vinegar and lemon juice, for e.g. - use agave nectar, stevia sugar, or regular sugar rather than artificial sweateners - steam or broil/roast veggies  - snack on veggies/fruit/nuts (unsalted, preferably) when possible, rather than processed foods - reduce or eliminate aspartame in diet (it is in diet sodas, chewing gum, etc) Read the labels!  Facebook page: Melissa Mckay AJ  Dr.  Katherina RightNeal Barnard's book: "Program for Reversing Diabetes" for the vegan concept and other ideas for healthy eating.

## 2015-09-20 NOTE — Progress Notes (Signed)
Patient ID: Melissa Mckay, female   DOB: 1980-04-04, 35 y.o.   MRN: [id number]   HPI  Melissa Mckay is a 35 y.o.-year-old female, self-referred for management of hypothyroidism. PCP: Dr Young Berry ([place]). She moved to [place] > is looking for a PCP there.  Pt. has been dx with hypothyroidism in 2007-2008 by a [facility] >> on Levothyroxine 50 mcg.  She takes the thyroid hormone: - fasting - with water - separated by <15 min from coffee + creamer - separated by >30 min from b'fast  - no calcium, iron, PPIs, multivitamins   No thyroid tests available for review.  Pt describes that since 2016 after the birth of her daughter (born prematurely due to mother's contracting CMV) >> now special-needs child (cerebral palsy): - + fatigue - + brain fog - + insomnia - + anxiety/depression - + hair loss - + weight gain - lost 60 lbs 10 years ago. - no cold intolerance - no constipation - no dry skin  Pt denies feeling nodules in neck, hoarseness, dysphagia/odynophagia, SOB with lying down.  She has + FH of thyroid disorders in: M, M uncle, MGM (hypothyroidism). No FH of thyroid cancer.  No h/o radiation tx to head or neck. No recent use of iodine supplements. Mother died at 73 y/o (BrCA).  Pt. also has a history of transaminitis, vit D deficiency.  ROS: Constitutional: + see HPI, + poor sleep Eyes: no blurry vision, no xerophthalmia ENT: no sore throat, no nodules palpated in throat, no dysphagia/odynophagia, no hoarseness, + tinnitus Cardiovascular: no CP/palpitations/+ leg swelling Respiratory: + cough/+ SOB/+ wheezing Gastrointestinal: + N/no V/D/C/+ acid reflux Musculoskeletal: + muscle/joint aches Skin: no rashes Neurological: no tremors/numbness/tingling/dizziness Psychiatric: + both depression/anxiety  Past Medical History  Diagnosis Date  . Asthma   . Hypothyroidism   . Chlamydia   . Genital HSV   . Normal pregnancy 09/10/2010  . Pregnancy  induced hypertension 2012    preE G1   Past Surgical History  Procedure Laterality Date  . 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 Cesarean section N/A 07/16/2014    Procedure: CESAREAN SECTION;  Surgeon: Cheri Fowler, MD;  Location: [facility];  Service: Obstetrics;  Laterality: N/A;   Social History   Social History  . Marital Status: Married    Spouse Name: N/A  . 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 Smoking status: Former Research scientist (life sciences)  . Smokeless tobacco: Not on file  . Alcohol Use: No  . Drug Use: No   Current Outpatient Prescriptions on File Prior to Visit  Medication Sig Dispense Refill  . acetaminophen (TYLENOL) 500 MG tablet Take 1,000 mg by mouth every 6 (six) hours as needed for mild pain. Reported on 09/20/2015    . albuterol (PROVENTIL HFA;VENTOLIN HFA) 108 (90 BASE) MCG/ACT inhaler Inhale 2 puffs into the lungs every 6 (six) hours as needed for wheezing or shortness of breath. Reported on 09/20/2015    . cetirizine (ZYRTEC) 10 MG tablet Take 10 mg by mouth daily.    Marland Kitchen ibuprofen (ADVIL,MOTRIN) 600 MG tablet Take 1 tablet (600 mg total) by mouth every 6 (six) hours. 30 tablet 0  . levothyroxine (SYNTHROID, LEVOTHROID) 50 MCG tablet Take 50 mcg by mouth daily.     . montelukast (SINGULAIR) 10 MG tablet Take 10 mg by mouth daily as needed. As needed for allergies    .  beclomethasone (QVAR) 40 MCG/ACT inhaler Inhale 2 puffs into the lungs 2 (two) times daily. Reported on 09/20/2015    . budesonide-formoterol (SYMBICORT) 160-4.5 MCG/ACT inhaler Inhale 2 puffs into the lungs 2 (two) times daily. Reported on 09/20/2015    . oxyCODONE-acetaminophen (PERCOCET/ROXICET) 5-325 MG per tablet Take 1-2 tablets by mouth every 4 (four) hours as needed for severe pain. (Patient not taking: Reported on 09/20/2015) 30 tablet 0   No current facility-administered medications on file prior to visit.   Allergies   Allergen Reactions  . Sulfa Antibiotics Hives and Shortness Of Breath   Family History  Problem Relation Age of Onset  . Cancer Mother   . Diabetes Mother   . Anemia    . COPD    . Depression    . Diabetes type II    . Fibromyalgia    . Hypertension    . Thyroid disease    . Diabetes Father    PE: BP 142/90 mmHg  Pulse 69  Ht 5' 3.5" (1.613 m)  Wt 250 lb (113.399 kg)  BMI 43.59 kg/m2  SpO2 97%  LMP 09/07/2015 Wt Readings from Last 3 Encounters:  09/20/15 250 lb (113.399 kg)  07/15/14 262 lb (118.842 kg)  01/31/14 232 lb (105.235 kg)   Constitutional: obese, in NAD Eyes: PERRLA, EOMI, no exophthalmos ENT: moist mucous membranes, no thyromegaly, no cervical lymphadenopathy Cardiovascular: RRR, No MRG Respiratory: CTA B Gastrointestinal: abdomen soft, NT, ND, BS+ Musculoskeletal: no deformities, strength intact in all 4 Skin: moist, warm, no rashes Neurological: no tremor with outstretched hands, DTR normal in all 4  ASSESSMENT: 1. Hypothyroidism  2. Fatigue  PLAN:  1. Patient with long-standing hypothyroidism, on levothyroxine therapy. She is on a minimum dose of levothyroxine, with previous stable labs while pregnant, but no recent thyroid tests performed since the birth of her daughter last year. She feels tired and has other hypothyroid sxs. - she does not appear to have a goiter, thyroid nodules, or neck compression symptoms - We discussed about correct intake of levothyroxine, fasting, with water, separated by at least 30 minutes from breakfast, and separated by more than 4 hours from calcium, iron, multivitamins, acid reflux medications (PPIs). Advised her to move the coffee with creamer at least 30 minutes after levothyroxine. - will check thyroid tests today: TSH, free T4, ATA and TPO Abs. If Ab's high >> can start Selenium supplement. - If labs today are abnormal, she will need to return in 6-8 weeks for repeat labs - Otherwise, I will see her back in 4  months  2. Fatigue - chronic, after the birth of her daughter - reviewed available labs: she has a h/o vit D def >> not on supplementation now. She is not taking any vitamins. She mentions that she got B12 injections in the past >> felt much better - We will check a B12 and vitamin D levels  vitamins, along with the thyroid tests, and will add a CMP and a lipid panel along with a HbA1c per her request  - We also discussed at length about changing to a lower fat, plant-based diet. Given references. She seems interested in this.   Orders Placed This Encounter  Procedures  . T4, free  . TSH  . T3, free  . Thyroglobulin antibody  . Thyroid peroxidase antibody  . COMPLETE METABOLIC PANEL WITH GFR  . Lipid panel  . Hemoglobin A1c  . VITAMIN D 25 Hydroxy (Vit-D Deficiency, Fractures)  . Vitamin B12  Needs LT4 refills.   Component     Latest Ref Rng & Units 09/20/2015  Sodium     135 - 146 mmol/L 140  Potassium     3.5 - 5.3 mmol/L 3.9  Chloride     98 - 110 mmol/L 103  CO2     20 - 31 mmol/L 26  Glucose     65 - 99 mg/dL 77  BUN     7 - 25 mg/dL 9  Creatinine     0.50 - 1.10 mg/dL 0.80  Total Bilirubin     0.2 - 1.2 mg/dL 0.7  Alkaline Phosphatase     33 - 115 U/L 60  AST     10 - 30 U/L 32 (H)  ALT     6 - 29 U/L 48 (H)  Total Protein     6.1 - 8.1 g/dL 7.1  Albumin     3.6 - 5.1 g/dL 4.1  Calcium     8.6 - 10.2 mg/dL 8.8  GFR, Est African American     >=60 mL/min >89  GFR, Est Non African American     >=60 mL/min >89  Cholesterol     0 - 200 mg/dL 174  Triglycerides     0.0 - 149.0 mg/dL 89.0  HDL Cholesterol     >39.00 mg/dL 52.70  VLDL     0.0 - 40.0 mg/dL 17.8  LDL (calc)     0 - 99 mg/dL 103 (H)  Total CHOL/HDL Ratio      3  NonHDL      121.19  T4,Free(Direct)     0.60 - 1.60 ng/dL 0.69  TSH     0.35 - 4.50 uIU/mL 2.22  Triiodothyronine,Free,Serum     2.3 - 4.2 pg/mL 2.7  Thyroglobulin Ab     <2 IU/mL 1  Thyroperoxidase Ab SerPl-aCnc      <9 IU/mL 245 (H)  Hemoglobin A1C     4.6 - 6.5 % 5.4  VITD     30.00 - 100.00 ng/mL 26.46 (L)  Vitamin B12     211 - 911 pg/mL 282   Vitamin B12 is low, and I would suggest 6 month of IM B12, after which, switch to 5000 g B12 by mouth daily. Vitamin D is low, I will suggest to start 2000 units vitamin D daily. HbA1c is normal. Thyroid tests are normal, with her TPO antibodies are high, confirming Hashimoto thyroiditis. I will suggest to start selenium. We'll continue 50 g levothyroxine daily. Cholesterol levels are normal. AST and ALT are high. I will suggest that she discusses this with her PCP. The rest of the labs are normal.  Philemon Kingdom, MD PhD [facility]

## 2015-09-21 LAB — THYROGLOBULIN ANTIBODY: Thyroglobulin Ab: 1 IU/mL (ref <2)

## 2015-09-21 LAB — THYROID PEROXIDASE ANTIBODY: Thyroperoxidase Ab SerPl-aCnc: 245 IU/mL — ABNORMAL HIGH (ref <9)

## 2015-09-23 ENCOUNTER — Telehealth: Payer: Self-pay | Admitting: Internal Medicine

## 2015-09-23 DIAGNOSIS — R5382 Chronic fatigue, unspecified: Secondary | ICD-10-CM | POA: Insufficient documentation

## 2015-09-23 MED ORDER — LEVOTHYROXINE SODIUM 50 MCG PO TABS: 50.0000 ug | ORAL_TABLET | Freq: Every day | ORAL | 3 refills | Status: DC

## 2015-09-23 NOTE — Telephone Encounter (Signed)
Patient would like the results of her recent labs °

## 2015-09-23 NOTE — Telephone Encounter (Signed)
Spoke to pt. Advised they had not been addressed by Dr Elvera Lennox as of this moment. She asked when they may be done. Asked if they could be done this evening.

## 2015-10-10 ENCOUNTER — Encounter: Payer: Self-pay | Admitting: Internal Medicine

## 2016-01-14 ENCOUNTER — Ambulatory Visit (INDEPENDENT_AMBULATORY_CARE_PROVIDER_SITE_OTHER): Payer: BLUE CROSS/BLUE SHIELD | Admitting: Internal Medicine

## 2016-01-14 ENCOUNTER — Encounter: Payer: Self-pay | Admitting: Internal Medicine

## 2016-01-14 DIAGNOSIS — E063 Autoimmune thyroiditis: Secondary | ICD-10-CM | POA: Diagnosis not present

## 2016-01-14 DIAGNOSIS — E538 Deficiency of other specified B group vitamins: Secondary | ICD-10-CM | POA: Diagnosis not present

## 2016-01-14 DIAGNOSIS — E559 Vitamin D deficiency, unspecified: Secondary | ICD-10-CM | POA: Insufficient documentation

## 2016-01-14 DIAGNOSIS — E038 Other specified hypothyroidism: Secondary | ICD-10-CM | POA: Diagnosis not present

## 2016-01-14 LAB — TSH: TSH: 1.4 u[IU]/mL (ref 0.35–4.50)

## 2016-01-14 LAB — T4, FREE: Free T4: 1.1 ng/dL (ref 0.60–1.60)

## 2016-01-14 LAB — VITAMIN D 25 HYDROXY (VIT D DEFICIENCY, FRACTURES): VITD: 22.54 ng/mL — ABNORMAL LOW (ref 30.00–100.00)

## 2016-01-14 NOTE — Patient Instructions (Signed)
Please stop at the lab.  Please continue Levothyroxine 50 mcg daily.  Take the thyroid hormone every day, with water, at least 30 minutes before breakfast, separated by at least 4 hours from: - acid reflux medications - calcium - iron - multivitamins  Please come back for a follow-up appointment in 6 months.   

## 2016-01-14 NOTE — Progress Notes (Signed)
Patient ID: Melissa Mckay, female   DOB: October 17, 1980, 35 y.o.   MRN: [id number]   HPI  Melissa Mckay is a 35 y.o.-year-old female, self-referred for management of Hashimoto's hypothyroidism, vitamin D and B12 insufficiency. PCP: Melissa Julie Martinique ([place]  She had GB surgery last week.   Pt. has been dx with hypothyroidism in 2007-2008 by a [facility] Clinic >> on Levothyroxine 50 mcg.  She takes the thyroid hormone: - fasting - with water - separated by >1h from coffee + creamer - separated by >30 min from b'fast  - no calcium, iron, PPIs, multivitamins   Last TSH was normal thyroid antibodies were high at last visit: Component     Latest Ref Rng & Units 09/20/2015  T4,Free(Direct)     0.60 - 1.60 ng/dL 0.69  TSH     0.35 - 4.50 uIU/mL 2.22  Triiodothyronine,Free,Serum     2.3 - 4.2 pg/mL 2.7  Thyroglobulin Ab     <2 IU/mL 1  Thyroperoxidase Ab SerPl-aCnc     <9 IU/mL 245 (H)   We started Selenium 200 mcg daily.  She c/o: - + less fatigue - resolved brain fog - no hair loss - + weight loss - no cold intolerance - no constipation - no dry skin  Pt denies feeling nodules in neck, hoarseness, dysphagia/odynophagia, SOB with lying down.  She has + FH of thyroid disorders in: M, M uncle, MGM (hypothyroidism). No FH of thyroid cancer.  No h/o radiation tx to head or neck. No recent use of iodine supplements. Mother died at 43 y/o (BrCA).  Pt. also has a history of transaminitis, vit D deficiency. Daughter born prematurely in 2016 - mother's contracting CMV >> now special-needs child (cerebral palsy).  ROS: Constitutional: + see HPI Eyes: no blurry vision, no xerophthalmia ENT: no sore throat, no nodules palpated in throat, no dysphagia/odynophagia, no hoarseness, + tinnitus Cardiovascular: no CP/palpitations/+ leg swelling Respiratory: no cough/SOB/wheezing Gastrointestinal: no N/V/D/C/acid reflux Musculoskeletal: no muscle/joint aches Skin:  no rashes Neurological: no tremors/numbness/tingling/dizziness + low libido  I reviewed pt's medications, allergies, PMH, social hx, family hx, and changes were documented in the history of present illness. Otherwise, unchanged from my initial visit note.   Past Medical History:  Diagnosis Date  . Asthma   . Chlamydia   . Genital HSV   . Hypothyroidism   . Normal pregnancy 09/10/2010  . Pregnancy induced hypertension 2012   preE G1   Past Surgical History:  Procedure Laterality Date  . 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 CESAREAN SECTION N/A 07/16/2014   Procedure: CESAREAN SECTION;  Surgeon: Melissa Fowler, MD;  Location: [facility];  Service: Obstetrics;  Laterality: N/A;   Social History   Social History  . Marital Status: Married    Spouse Name: N/A  . 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 Smoking status: Former Research scientist (life sciences)  . Smokeless tobacco: Not on file  . Alcohol Use: No  . Drug Use: No   Current Outpatient Prescriptions on File Prior to Visit  Medication Sig Dispense Refill  . acetaminophen (TYLENOL) 500 MG tablet Take 1,000 mg by mouth every 6 (six) hours as needed for mild pain. Reported on 09/20/2015    . albuterol (PROVENTIL HFA;VENTOLIN HFA) 108 (90 BASE) MCG/ACT inhaler Inhale 2 puffs into the lungs every 6 (six) hours as needed for  wheezing or shortness of breath. Reported on 09/20/2015    . beclomethasone (QVAR) 40 MCG/ACT inhaler Inhale 2 puffs into the lungs 2 (two) times daily. Reported on 09/20/2015    . budesonide-formoterol (SYMBICORT) 160-4.5 MCG/ACT inhaler Inhale 2 puffs into the lungs 2 (two) times daily. Reported on 09/20/2015    . cetirizine (ZYRTEC) 10 MG tablet Take 10 mg by mouth daily.    Marland Kitchen ibuprofen (ADVIL,MOTRIN) 600 MG tablet Take 1 tablet (600 mg total) by mouth every 6 (six) hours. 30 tablet 0  . levothyroxine (SYNTHROID, LEVOTHROID) 50 MCG tablet Take 1  tablet (50 mcg total) by mouth daily. 90 tablet 3  . montelukast (SINGULAIR) 10 MG tablet Take 10 mg by mouth daily as needed. As needed for allergies    . oxyCODONE-acetaminophen (PERCOCET/ROXICET) 5-325 MG per tablet Take 1-2 tablets by mouth every 4 (four) hours as needed for severe pain. 30 tablet 0   No current facility-administered medications on file prior to visit.    Allergies  Allergen Reactions  . Sulfa Antibiotics Hives and Shortness Of Breath   Family History  Problem Relation Age of Onset  . Cancer Mother   . Diabetes Mother   . Anemia    . COPD    . Depression    . Diabetes type II    . Fibromyalgia    . Hypertension    . Thyroid disease    . Diabetes Father    PE: BP 112/88   Pulse 78   Ht '5\' 4"'  (1.626 m)   Wt 244 lb (110.7 kg)   LMP 01/07/2016 (Approximate)   SpO2 97%   BMI 41.88 kg/m  Wt Readings from Last 3 Encounters:  01/14/16 244 lb (110.7 kg)  09/20/15 250 lb (113.4 kg)  07/15/14 262 lb (118.8 kg)   Constitutional: obese, in NAD Eyes: PERRLA, EOMI, no exophthalmos ENT: moist mucous membranes, no thyromegaly, no cervical lymphadenopathy Cardiovascular: RRR, No MRG Respiratory: CTA B Gastrointestinal: abdomen soft, NT, ND, BS+ Musculoskeletal: no deformities, strength intact in all 4 Skin: moist, warm, no rashes Neurological: no tremor with outstretched hands, DTR normal in all 4  ASSESSMENT: 1. Hashimoto's Hypothyroidism  2. Low Vit B12   3. Vit D insufficiency  PLAN:  1. Patient with long-standing hypothyroidism, on levothyroxine therapy + Selenium supplementation started at last visit, after we dx'ed Hashimoto's thyroiditis. She feels much better at this visit, with much less fatigue. - she does not appear to have a goiter, thyroid nodules, or neck compression symptoms - We discussed about correct intake of levothyroxine, fasting, with water, separated by at least 30 minutes from breakfast, and separated by more than 4 hours from  calcium, iron, multivitamins, acid reflux medications (PPIs). She is now taking it correctly. - will check thyroid tests today: TSH, free T4, TPO Abs - If labs today are abnormal, she will need to return in 6 weeks for repeat labs - I will see her back in 6 months  2. Low Vit B12  - She got B12 injections in the past >> felt much better - at last visit, B12 was close to the LLN >> I suggested B12 inj >> started per PCP  3. Vit D insufficiency - she has a h/o vit D def  - last visit's level was low >> we started 2000 IU daily - recheck level today  Needs refills LT4.   Component     Latest Ref Rng & Units 01/14/2016  T4,Free(Direct)  0.60 - 1.60 ng/dL 1.10  TSH     0.35 - 4.50 uIU/mL 1.40  Thyroperoxidase Ab SerPl-aCnc     <9 IU/mL 143 (H)  VITD     30.00 - 100.00 ng/mL 22.54 (L)   TFTs are normal and her TPO antibodies have improved.  Vitamin D is lower, I will have her increase the dose to 5000 units daily.  Philemon Kingdom, MD PhD [facility]

## 2016-01-15 LAB — THYROID PEROXIDASE ANTIBODY: THYROID PEROXIDASE ANTIBODY: 143 [IU]/mL — AB (ref <9)

## 2016-01-15 MED ORDER — LEVOTHYROXINE SODIUM 50 MCG PO TABS
50.0000 ug | ORAL_TABLET | Freq: Every day | ORAL | 3 refills | Status: DC
Start: 2016-01-15 — End: 2017-01-20

## 2016-07-13 ENCOUNTER — Ambulatory Visit (INDEPENDENT_AMBULATORY_CARE_PROVIDER_SITE_OTHER): Payer: BLUE CROSS/BLUE SHIELD | Admitting: Internal Medicine

## 2016-07-13 ENCOUNTER — Encounter: Payer: Self-pay | Admitting: Internal Medicine

## 2016-07-13 VITALS — BP 134/84 | HR 72 | Wt 246.0 lb

## 2016-07-13 DIAGNOSIS — E538 Deficiency of other specified B group vitamins: Secondary | ICD-10-CM | POA: Diagnosis not present

## 2016-07-13 DIAGNOSIS — E559 Vitamin D deficiency, unspecified: Secondary | ICD-10-CM | POA: Diagnosis not present

## 2016-07-13 DIAGNOSIS — E063 Autoimmune thyroiditis: Secondary | ICD-10-CM | POA: Diagnosis not present

## 2016-07-13 DIAGNOSIS — E038 Other specified hypothyroidism: Secondary | ICD-10-CM | POA: Diagnosis not present

## 2016-07-13 LAB — TSH: TSH: 2.76 u[IU]/mL (ref 0.35–4.50)

## 2016-07-13 LAB — VITAMIN B12: Vitamin B-12: 610 pg/mL (ref 211–911)

## 2016-07-13 LAB — T4, FREE: FREE T4: 0.83 ng/dL (ref 0.60–1.60)

## 2016-07-13 NOTE — Progress Notes (Signed)
Patient ID: Melissa Mckay, female   DOB: 09/19/1980, 36 y.o.   MRN: [id number]   HPI  Melissa Mckay is a 36 y.o.-year-old female, self-referred for management of Hashimoto's hypothyroidism, vitamin D and B12 insufficiency. PCP: Dr Julie [place] [facility]  Pt. has been dx with hypothyroidism in 2007-2008 by a [facility] Clinic >> on Levothyroxine 50 mcg.  Pt takes LT4: - in am - fasting - at least 1h from b'fast - no Ca, Fe, PIs - + vitamins to support thyroid 2.5 h later - not on Biotin  TFTs: Lab Results  Component Value Date   TSH 1.40 01/14/2016   TSH 2.22 09/20/2015   FREET4 1.10 01/14/2016   FREET4 0.69 09/20/2015   T3FREE 2.7 09/20/2015   Thyroid Abs improving on Selenium 200 mg daily: Component     Latest Ref Rng & Units 01/14/2016  Thyroperoxidase Ab SerPl-aCnc     <9 IU/mL 143 (H)   Component     Latest Ref Rng & Units 09/20/2015  Thyroglobulin Ab     <2 IU/mL 1  Thyroperoxidase Ab SerPl-aCnc     <9 IU/mL 245 (H)   Pt denies: - feeling nodules in neck - hoarseness - dysphagia - choking - SOB with lying down  No FH of thyroid cancer. No h/o radiation tx to head or neck.  No seaweed or kelp. No recent contrast studies. No herbal supplements. No Biotin use. No recent steroids use.   vit D deficiency: Last vitamin D: Component     Latest Ref Rng & Units 01/14/2016  VITD     30.00 - 100.00 ng/mL 22.54 (L)   We increased supplement to 4000 units daily.  She has a daughter born prematurely in 2016 - mother's contracting CMV >> now special-needs child (cerebral palsy).  Since last visit, she started to change her diet and walking.  ROS: Constitutional: + see HPI, + nocturia Eyes: no blurry vision, no xerophthalmia ENT: no sore throat, no nodules palpated in throat, no dysphagia, no odynophagia, no hoarseness Cardiovascular: + CP - when anxious/no SOB/+ palpitations/+ leg swelling Respiratory: no cough/no SOB/no  wheezing Gastrointestinal: no N/no V/no D/no C/no acid reflux Musculoskeletal: + muscle aches/+ joint aches Skin: no rashes, no hair loss Neurological: no tremors/+ numbness (carpal tunnel)/no tingling/no dizziness  I reviewed pt's medications, allergies, PMH, social hx, family hx, and changes were documented in the history of present illness. Otherwise, unchanged from my initial visit note.  Past Medical History:  Diagnosis Date  . Asthma   . Chlamydia   . Genital HSV   . Hypothyroidism   . Normal pregnancy 09/10/2010  . Pregnancy induced hypertension 2012   preE G1   Past Surgical History:  Procedure Laterality Date  . 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 CESAREAN SECTION N/A 07/16/2014   Procedure: CESAREAN SECTION;  Surgeon: Lavina Hammanodd Meisinger, MD;  Location: [facility];  Service: Obstetrics;  Laterality: N/A;   Social History   Social History  . Marital Status: Married    Spouse Name: N/A  . 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 Smoking status: Former Games developermoker  . Smokeless tobacco: Not on file  . Alcohol Use: No  . Drug Use: No   Current Outpatient Prescriptions on File Prior to Visit  Medication Sig Dispense Refill  . albuterol (PROVENTIL HFA;VENTOLIN HFA) 108 (90 BASE) MCG/ACT  inhaler Inhale 2 puffs into the lungs every 6 (six) hours as needed for wheezing or shortness of breath. Reported on 09/20/2015    . cetirizine (ZYRTEC) 10 MG tablet Take 10 mg by mouth daily.    Marland Kitchen levothyroxine (SYNTHROID, LEVOTHROID) 50 MCG tablet Take 1 tablet (50 mcg total) by mouth daily. 90 tablet 3  . montelukast (SINGULAIR) 10 MG tablet Take 10 mg by mouth daily as needed. As needed for allergies     No current facility-administered medications on file prior to visit.    Allergies  Allergen Reactions  . Sulfa Antibiotics Hives and Shortness Of Breath   Family History  Problem Relation Age of Onset   . Cancer Mother   . Diabetes Mother   . Anemia Unknown   . COPD Unknown   . Depression Unknown   . Diabetes type II Unknown   . Fibromyalgia Unknown   . Hypertension Unknown   . Thyroid disease Unknown   . Diabetes Father    PE: BP 134/84 (BP Location: Left Arm, Patient Position: Sitting)   Pulse 72   Wt 246 lb (111.6 kg)   LMP 06/24/2016   SpO2 97%   BMI 42.23 kg/m  Wt Readings from Last 3 Encounters:  07/13/16 246 lb (111.6 kg)  01/14/16 244 lb (110.7 kg)  09/20/15 250 lb (113.4 kg)   Constitutional: obese, in NAD Eyes: PERRLA, EOMI, no exophthalmos ENT: moist mucous membranes, no thyromegaly, no cervical lymphadenopathy Cardiovascular: RRR, No MRG Respiratory: CTA B Gastrointestinal: abdomen soft, NT, ND, BS+ Musculoskeletal: no deformities, strength intact in all 4 Skin: moist, warm, no rashes Neurological: + tremor with outstretched hands, DTR normal in all 4  ASSESSMENT: 1. Hashimoto's Hypothyroidism  2. Low Vit B12   3. Vit D insufficiency  PLAN:  1. Patient with long-standing hypothyroidism, on levothyroxine therapy + Selenium supplementation started after we dx'ed Hashimoto's thyroiditis - latest thyroid labs reviewed with pt >> normal  - she continues on LT4 50 mcg daily - pt feels good on this dose, but still has some fatigue - we discussed about taking the thyroid hormone every day, with water, >30 minutes before breakfast, separated by >4 hours from acid reflux medications, calcium, iron, multivitamins. Pt. is taking it correctly - will check thyroid tests today: TSH and fT4 - If labs are abnormal, she will need to return for repeat TFTs in 1.5 months - OTW, RTC in 6 mo  2. Low Vit B12  - She got B12 injections in the past >> felt much better - started B12 inj per PCP - will check level per her request  3. Vit D insufficiency - she has a h/o vit D def  - last visit's level was low >> increased to 4000 IU daily - recheck level  today  Component     Latest Ref Rng & Units 07/13/2016  T4,Free(Direct)     0.60 - 1.60 ng/dL 1.61  TSH     0.96 - 0.45 uIU/mL 2.76  VITD     30.00 - 100.00 ng/mL 31.25  Vitamin B12     211 - 911 pg/mL 610  All labs normal >> continue current treatment/supplementation.  Carlus Pavlov, MD PhD [facility]

## 2016-07-13 NOTE — Patient Instructions (Signed)
Please stop at the lab.  Please continue Levothyroxine 50 mcg daily.  Take the thyroid hormone every day, with water, at least 30 minutes before breakfast, separated by at least 4 hours from: - acid reflux medications - calcium - iron - multivitamins  Please come back for a follow-up appointment in 6 months.   

## 2016-07-14 LAB — VITAMIN D 25 HYDROXY (VIT D DEFICIENCY, FRACTURES): VITD: 31.25 ng/mL (ref 30.00–100.00)

## 2016-08-31 ENCOUNTER — Other Ambulatory Visit: Payer: Self-pay | Admitting: Internal Medicine

## 2016-09-17 ENCOUNTER — Encounter: Payer: Self-pay | Admitting: Internal Medicine

## 2017-01-13 ENCOUNTER — Encounter: Payer: Self-pay | Admitting: Internal Medicine

## 2017-01-13 ENCOUNTER — Ambulatory Visit (INDEPENDENT_AMBULATORY_CARE_PROVIDER_SITE_OTHER): Payer: BLUE CROSS/BLUE SHIELD | Admitting: Internal Medicine

## 2017-01-13 VITALS — BP 134/82 | HR 90 | Wt 243.0 lb

## 2017-01-13 DIAGNOSIS — E236 Other disorders of pituitary gland: Secondary | ICD-10-CM

## 2017-01-13 DIAGNOSIS — E038 Other specified hypothyroidism: Secondary | ICD-10-CM

## 2017-01-13 DIAGNOSIS — E538 Deficiency of other specified B group vitamins: Secondary | ICD-10-CM | POA: Diagnosis not present

## 2017-01-13 DIAGNOSIS — E063 Autoimmune thyroiditis: Secondary | ICD-10-CM | POA: Diagnosis not present

## 2017-01-13 DIAGNOSIS — E559 Vitamin D deficiency, unspecified: Secondary | ICD-10-CM | POA: Diagnosis not present

## 2017-01-13 NOTE — Patient Instructions (Addendum)
Please continue Levothyroxine 50 mcg daily.  Take the thyroid hormone every day, with water, at least 30 minutes before breakfast, separated by at least 4 hours from: - acid reflux medications - calcium - iron - multivitamins  Please stop B12 injections and start 5000 mcg daily.  Continue vitamin D 4000 units daily.  Come back for labs at 8 am, fasting, in 1 week. No steroids before the test.  Please come back for a follow-up appointment in 6 months

## 2017-01-13 NOTE — Progress Notes (Addendum)
Patient ID: Melissa Mckay, female   DOB: 05-21-1980, 36 y.o.   MRN: [id number]   HPI  Melissa Mckay is a 36 y.o.-year-old female, self-referred for management of Hashimoto's hypothyroidism, vitamin D and B12 insufficiency. Last visit 6 mo ago. PCP: Dr Julie [place] [facility]) [facility]  She started to have more migraines >> had an MRI >> empty sella.   She was started on antihypertensives >> started Losartan, HCTZ.  She also had more anxiety an depression >> started Zoloft (prev. Lexapro) and Xanax.  Pt. has been dx with hypothyroidism in 2007-2008 by a [facility] >> on Levothyroxine 50 mcg.  Pt takes LT4: - in am - fasting - at least 60 min from b'fast - no Ca, Fe, MVI, PPIs - not on Biotin - + vitamins to support thyroid 2.5 h later  TFTs: Lab Results  Component Value Date   TSH 2.76 07/13/2016   TSH 1.40 01/14/2016   TSH 2.22 09/20/2015   FREET4 0.83 07/13/2016   FREET4 1.10 01/14/2016   FREET4 0.69 09/20/2015   T3FREE 2.7 09/20/2015   Thyroid Abs improved on Selenium 200 mg daily: Component     Latest Ref Rng & Units 01/14/2016  Thyroperoxidase Ab SerPl-aCnc     <9 IU/mL 143 (H)   Component     Latest Ref Rng & Units 09/20/2015  Thyroglobulin Ab     <2 IU/mL 1  Thyroperoxidase Ab SerPl-aCnc     <9 IU/mL 245 (H)   Pt denies: - feeling nodules in neck - hoarseness - dysphagia - choking - SOB with lying down No FH of thyroid cancer. No FH of thyroid cancer. No h/o radiation tx to head or neck.  No seaweed or kelp. No recent contrast studies. No herbal supplements. No Biotin use. No recent steroids use.   vit D deficiency: Last vitamin D: Lab Results  Component Value Date   VD25OH 31.25 07/13/2016   VD25OH 22.54 (L) 01/14/2016   VD25OH 26.46 (L) 09/20/2015   She takes vitamin D 4000 units daily.  B12 deficiency: Lab Results  Component Value Date   VITAMINB12 610 07/13/2016   VITAMINB12 282 09/20/2015   She has a daughter born  prematurely in 2016 - mother's contracting CMV >> now special-needs child (cerebral palsy).  ROS: Constitutional: no weight gain/no weight loss, + fatigue, no subjective hyperthermia, no subjective hypothermia Eyes: no blurry vision, no xerophthalmia ENT: no sore throat, + see HPI Cardiovascular: no CP/no SOB/no palpitations/+ leg swelling Respiratory: no cough/no SOB/no wheezing Gastrointestinal: no N/no V/+ D/no C/no acid reflux Musculoskeletal: + muscle aches/no joint aches Skin: no rashes, no hair loss Neurological: no tremors/no numbness/no tingling/no dizziness, + HA  I reviewed pt's medications, allergies, PMH, social hx, family hx, and changes were documented in the history of present illness. Otherwise, unchanged from my initial visit note.   Past Medical History:  Diagnosis Date  . Asthma   . Chlamydia   . Genital HSV   . Hypothyroidism   . Normal pregnancy 09/10/2010  . Pregnancy induced hypertension 2012   preE G1   Past Surgical History:  Procedure Laterality Date  . BREAST REDUCTION SURGERY     benign tumor removed from left breast   Social History   Social History  . Marital Status: Married    Spouse Name: N/A  . Number of Children: 2   Occupational History  . [employer], caregiver   Social History Main Topics  . Smoking status: Former Games developer  .  Smokeless tobacco: Not on file  . Alcohol Use: No  . Drug Use: No   Current Outpatient Medications on File Prior to Visit  Medication Sig Dispense Refill  . albuterol (PROVENTIL HFA;VENTOLIN HFA) 108 (90 BASE) MCG/ACT inhaler Inhale 2 puffs into the lungs every 6 (six) hours as needed for wheezing or shortness of breath. Reported on 09/20/2015    . cetirizine (ZYRTEC) 10 MG tablet Take 10 mg by mouth daily.    Marland Kitchen. levothyroxine (SYNTHROID, LEVOTHROID) 50 MCG tablet Take 1 tablet (50 mcg total) by mouth daily. 90 tablet 3  . levothyroxine (SYNTHROID, LEVOTHROID) 50 MCG tablet TAKE 1 TABLET (50 MCG TOTAL) BY MOUTH DAILY.  90 tablet 3  . losartan (COZAAR) 50 MG tablet Take 50 mg by mouth.    . mometasone-formoterol (DULERA) 200-5 MCG/ACT AERO Dulera 200 mcg-5 mcg/actuation HFA aerosol inhaler    . montelukast (SINGULAIR) 10 MG tablet Take 10 mg by mouth daily as needed. As needed for allergies    . hydrochlorothiazide (HYDRODIURIL) 25 MG tablet Take 25 mg daily by mouth.  3   No current facility-administered medications on file prior to visit.    Allergies  Allergen Reactions  . Sulfa Antibiotics Hives and Shortness Of Breath   Family History  Problem Relation Age of Onset  . Cancer Mother   . Diabetes Mother   . Anemia Unknown   . COPD Unknown   . Depression Unknown   . Diabetes type II Unknown   . Fibromyalgia Unknown   . Hypertension Unknown   . Thyroid disease Unknown   . Diabetes Father    PE: BP 134/82 (BP Location: Left Arm, Patient Position: Sitting)   Pulse 90   Wt 243 lb (110.2 kg)   LMP 01/04/2017   SpO2 97%   BMI 41.71 kg/m  Wt Readings from Last 3 Encounters:  01/13/17 243 lb (110.2 kg)  07/13/16 246 lb (111.6 kg)  01/14/16 244 lb (110.7 kg)   Constitutional: overweight, in NAD Eyes: PERRLA, EOMI, no exophthalmos ENT: moist mucous membranes, no thyromegaly, no cervical lymphadenopathy Cardiovascular: RRR, No MRG Respiratory: CTA B Gastrointestinal: abdomen soft, NT, ND, BS+ Musculoskeletal: no deformities, strength intact in all 4 Skin: moist, warm, no rashes Neurological: no tremor with outstretched hands, DTR normal in all 4  ASSESSMENT: 1. Hashimoto's Hypothyroidism  2. Low Vit B12   3. Vit D insufficiency  4. Empty sella - new dx  PLAN:  1. Patient with long-standing hypothyroidism, on levothyroxine therapy + Selenium supplementation started after we dx'ed Hashimoto's thyroiditis - latest thyroid labs reviewed with pt >> normal  - she continues on LT4 50 mcg daily - pt feels good on this dose. - we discussed about taking the thyroid hormone every day, with  water, >30 minutes before breakfast, separated by >4 hours from acid reflux medications, calcium, iron, multivitamins. Pt. is taking it correctly - will check thyroid tests today: TSH and fT4 - If labs are abnormal, she will need to return for repeat TFTs in 1.5 months - OTW, RTC in 1 year  2. Low Vit B12  - on B12 inj per PCP >> last level >1500 >> recommended to switch to po B12 5000 Iu daily - will check level at next visit  3. Vit D insufficiency - last visit's level was normal on 4000 IU daily >> continue this dose - recheck level at next visit  4. Empty sella - discussed the 2 possible origins: tumoral and nontumoral (idiopathic). She has  no pituitary tumor. - discussed the need to check her pituitary hh >> will return for labs at 8 am, fasting Orders Placed This Encounter  Procedures  . Insulin-like growth factor  . Follicle stimulating hormone  . Luteinizing hormone  . T4, free  . TSH  . Cortisol  . ACTH   Needs refils LT4.  Component     Latest Ref Rng & Units 01/18/2017  IGF-I, LC/MS     53 - 331 ng/mL 124  Z-Score (Female)     -2.0 - 2 SD -0.3  T4,Free(Direct)     0.60 - 1.60 ng/dL 4.090.78  TSH     8.110.35 - 9.144.50 uIU/mL 4.00  C206 ACTH     6 - 50 pg/mL 22  Cortisol, Plasma     ug/dL 78.213.5  LH     mIU/mL 9.564.03  FSH     mIU/ML 4.0   Normal pituitary labs. TSH high in the normal range >> we increased LT4 to 75 mcg daily.  Carlus Pavlovristina Joshua Zeringue, MD PhD [facility]

## 2017-01-18 ENCOUNTER — Other Ambulatory Visit (INDEPENDENT_AMBULATORY_CARE_PROVIDER_SITE_OTHER): Payer: BLUE CROSS/BLUE SHIELD

## 2017-01-18 DIAGNOSIS — E236 Other disorders of pituitary gland: Secondary | ICD-10-CM

## 2017-01-18 DIAGNOSIS — E038 Other specified hypothyroidism: Secondary | ICD-10-CM

## 2017-01-18 DIAGNOSIS — E063 Autoimmune thyroiditis: Secondary | ICD-10-CM

## 2017-01-18 LAB — TSH: TSH: 4 u[IU]/mL (ref 0.35–4.50)

## 2017-01-18 LAB — CORTISOL: CORTISOL PLASMA: 13.5 ug/dL

## 2017-01-18 LAB — FOLLICLE STIMULATING HORMONE: FSH: 4 m[IU]/mL

## 2017-01-18 LAB — T4, FREE: Free T4: 0.78 ng/dL (ref 0.60–1.60)

## 2017-01-18 LAB — LUTEINIZING HORMONE: LH: 4.03 m[IU]/mL

## 2017-01-20 ENCOUNTER — Encounter: Payer: Self-pay | Admitting: Internal Medicine

## 2017-01-20 ENCOUNTER — Other Ambulatory Visit: Payer: Self-pay | Admitting: Internal Medicine

## 2017-01-20 DIAGNOSIS — E063 Autoimmune thyroiditis: Principal | ICD-10-CM

## 2017-01-20 DIAGNOSIS — E038 Other specified hypothyroidism: Secondary | ICD-10-CM

## 2017-01-20 MED ORDER — LEVOTHYROXINE SODIUM 75 MCG PO TABS: 75.0000 ug | ORAL_TABLET | Freq: Every day | ORAL | 1 refills | Status: DC

## 2017-01-22 LAB — ACTH: C206 ACTH: 22 pg/mL (ref 6–50)

## 2017-01-22 LAB — INSULIN-LIKE GROWTH FACTOR
IGF-I, LC/MS: 124 ng/mL (ref 53–331)
Z-SCORE (FEMALE): -0.3 {STDV} (ref ?–2.0)

## 2017-01-25 ENCOUNTER — Other Ambulatory Visit: Payer: Self-pay | Admitting: Internal Medicine

## 2017-01-25 MED ORDER — LEVOTHYROXINE SODIUM 75 MCG PO TABS: 75.0000 ug | ORAL_TABLET | Freq: Every day | ORAL | 1 refills | Status: DC

## 2017-03-18 ENCOUNTER — Telehealth: Payer: Self-pay | Admitting: Internal Medicine

## 2017-03-18 MED ORDER — LEVOTHYROXINE SODIUM 75 MCG PO TABS: 75.0000 ug | ORAL_TABLET | Freq: Every day | ORAL | 1 refills | Status: DC

## 2017-03-18 NOTE — Telephone Encounter (Signed)
Medication sent.

## 2017-03-18 NOTE — Telephone Encounter (Signed)
Need prescription for, levothyroxine (SYNTHROID, LEVOTHROID) 75 MCG tablet [[id number]][[id number]] Send to  [facility] [phone][facility][id number] - [place], [place] - [address] [phone] (Phone) [phone] (Fax)   She is totally out

## 2017-03-19 ENCOUNTER — Other Ambulatory Visit: Payer: Self-pay

## 2017-03-19 MED ORDER — LEVOTHYROXINE SODIUM 75 MCG PO TABS: 75.0000 ug | ORAL_TABLET | Freq: Every day | ORAL | 1 refills | Status: DC

## 2017-07-13 ENCOUNTER — Encounter: Payer: Self-pay | Admitting: Internal Medicine

## 2017-07-13 ENCOUNTER — Ambulatory Visit: Payer: BLUE CROSS/BLUE SHIELD | Admitting: Internal Medicine

## 2017-07-13 VITALS — BP 114/72 | HR 83 | Ht 64.0 in | Wt 234.4 lb

## 2017-07-13 DIAGNOSIS — E559 Vitamin D deficiency, unspecified: Secondary | ICD-10-CM | POA: Diagnosis not present

## 2017-07-13 DIAGNOSIS — E538 Deficiency of other specified B group vitamins: Secondary | ICD-10-CM

## 2017-07-13 DIAGNOSIS — E063 Autoimmune thyroiditis: Secondary | ICD-10-CM

## 2017-07-13 DIAGNOSIS — R7303 Prediabetes: Secondary | ICD-10-CM

## 2017-07-13 DIAGNOSIS — E038 Other specified hypothyroidism: Secondary | ICD-10-CM

## 2017-07-13 DIAGNOSIS — E230 Hypopituitarism: Secondary | ICD-10-CM | POA: Insufficient documentation

## 2017-07-13 LAB — HEMOGLOBIN A1C: HEMOGLOBIN A1C: 5.5 % (ref 4.6–6.5)

## 2017-07-13 LAB — T4, FREE: Free T4: 0.94 ng/dL (ref 0.60–1.60)

## 2017-07-13 LAB — TSH: TSH: 2.8 u[IU]/mL (ref 0.35–4.50)

## 2017-07-13 LAB — VITAMIN D 25 HYDROXY (VIT D DEFICIENCY, FRACTURES): VITD: 28.55 ng/mL — ABNORMAL LOW (ref 30.00–100.00)

## 2017-07-13 LAB — VITAMIN B12: Vitamin B-12: >1500 pg/mL — ABNORMAL HIGH (ref 211–911)

## 2017-07-13 MED ORDER — LEVOTHYROXINE SODIUM 75 MCG PO TABS: 75.0000 ug | ORAL_TABLET | Freq: Every day | ORAL | 3 refills | Status: DC

## 2017-07-13 NOTE — Patient Instructions (Signed)
Please continue Levothyroxine 75 mcg daily.  Take the thyroid hormone every day, with water, at least 30 minutes before breakfast, separated by at least 4 hours from: - acid reflux medications - calcium - iron - multivitamins  Continue vitamin B12 5000 mcg daily.  Continue vitamin D 4000 units daily.  Please stop at the lab.  Please come back for a follow-up appointment in 6 months.

## 2017-07-13 NOTE — Progress Notes (Signed)
Patient ID: Melissa Mckay, female   DOB: 1981-02-09, 37 y.o.   MRN: [id number]   HPI  Melissa Mckay is a 37 y.o.-year-old female, self-referred for management of Hashimoto's hypothyroidism, vitamin D and B12 insufficiency. Last visit 6 mo ago. PCP: Dr Julie [place] [facility]) [facility]  She had a long bleeding period last month >> resolved. Sees ObGyn.  She was recently dx'ed with prediabetes 3 mo ago. She would like the HbA1c repeated. HbA1c was 6% now.  She has been dx'ed with OSA >> started on CPAP.  Partially empty sella:  -Incidentally found before last visit during investigation for migraines  Reviewed pituitary work-up performed at last visit - all normal: Component of     Latest Ref Rng & Units 01/18/2017  IGF-I, LC/MS     53 - 331 ng/mL 124  Z-Score (Female)     -2.0 - 2 SD -0.3  C206 ACTH     6 - 50 pg/mL 22  Cortisol, Plasma     ug/dL 40.9  LH     mIU/mL 8.11  FSH     mIU/ML 4.0   Hypothyroidism: - dx 2007-2008 by a [facility] >> started on levothyroxine 50 mcg daily  Last dose change was in 12/2016: Increased from 50 to 75 mcg daily.  Pt is on levothyroxine 75 mcg daily, taken: - in am - fasting - at least 60 min from b'fast - no Ca, Fe, PPIs, MVI - not on Biotin - + Se at night  Reviewed TFTs: Lab Results  Component Value Date   TSH 4.00 01/18/2017   TSH 2.76 07/13/2016   TSH 1.40 01/14/2016   TSH 2.22 09/20/2015   FREET4 0.78 01/18/2017   FREET4 0.83 07/13/2016   FREET4 1.10 01/14/2016   FREET4 0.69 09/20/2015   T3FREE 2.7 09/20/2015   Thyroid antibodies improved on selenium 200 mg daily: Component     Latest Ref Rng & Units 01/14/2016  Thyroperoxidase Ab SerPl-aCnc     <9 IU/mL 143 (H)   Component     Latest Ref Rng & Units 09/20/2015  Thyroglobulin Ab     <2 IU/mL 1  Thyroperoxidase Ab SerPl-aCnc     <9 IU/mL 245 (H)   Pt denies: - feeling nodules in neck - dysphagia - choking - SOB with lying down But does  have hoarseness  No FH of thyroid cancer. No FH of thyroid cancer. No h/o radiation tx to head or neck.  No seaweed or kelp. No recent contrast studies. No herbal supplements. No Biotin use. No recent steroids use.   vit D deficiency:  Last vitamin D was normal: Lab Results  Component Value Date   VD25OH 31.25 07/13/2016   VD25OH 22.54 (L) 01/14/2016   VD25OH 26.46 (L) 09/20/2015   She takes vitamin D 4000 units daily.  B12 deficiency:  Reviewed the most recent levels, normal: Lab Results  Component Value Date   VITAMINB12 610 07/13/2016   VITAMINB12 282 09/20/2015   Previously on B12 injections, now on 5000 mcg daily, started at last visit.  She has a daughter born prematurely in 2016 - mother's contracting CMV >> now special-needs child (cerebral palsy).  ROS: Constitutional: + weight loss, + fatigue, no subjective hyperthermia, no subjective hypothermia Eyes: no blurry vision, no xerophthalmia ENT: no sore throat,+ see HPI Cardiovascular: no CP/no SOB/no palpitations/no leg swelling Respiratory: + cough/no SOB/+ wheezing Gastrointestinal: no N/no V/no D/no C/no acid reflux Musculoskeletal: + muscle aches/no joint aches Skin:  no rashes, no hair loss Neurological: no tremors/no numbness/no tingling/no dizziness, + HAs (sees neurology) - on Elavil  I reviewed pt's medications, allergies, PMH, social hx, family hx, and changes were documented in the history of present illness. Otherwise, unchanged from my initial visit note. She increased the dose of HCTZ to 50 mg daily.  Past Medical History:  Diagnosis Date  . Asthma   . Chlamydia   . Genital HSV   . Hypothyroidism   . Normal pregnancy 09/10/2010  . Pregnancy induced hypertension 2012   preE G1   Past Surgical History:  Procedure Laterality Date  . 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 CESAREAN SECTION N/A 07/16/2014   Procedure: CESAREAN SECTION;  Surgeon: Lavina Hamman, MD;   Location: [facility];  Service: Obstetrics;  Laterality: N/A;   Social History   Social History  . Marital Status: Married    Spouse Name: N/A  . Number of Children: 2   Occupational History  . [employer], caregiver   Social History Main Topics  . Smoking status: Former Games developer  . Smokeless tobacco: Not on file  . Alcohol Use: No  . Drug Use: No   Current Outpatient Medications on File Prior to Visit  Medication Sig Dispense Refill  . albuterol (PROVENTIL HFA;VENTOLIN HFA) 108 (90 BASE) MCG/ACT inhaler Inhale 2 puffs into the lungs every 6 (six) hours as needed for wheezing or shortness of breath. Reported on 09/20/2015    . cetirizine (ZYRTEC) 10 MG tablet Take 10 mg by mouth daily.    . hydrochlorothiazide (HYDRODIURIL) 25 MG tablet Take 25 mg daily by mouth.  3  . levothyroxine (SYNTHROID, LEVOTHROID) 75 MCG tablet Take 1 tablet (75 mcg total) by mouth daily. 45 tablet 1  . losartan (COZAAR) 50 MG tablet Take 50 mg by mouth.    . mometasone-formoterol (DULERA) 200-5 MCG/ACT AERO Dulera 200 mcg-5 mcg/actuation HFA aerosol inhaler    . montelukast (SINGULAIR) 10 MG tablet Take 10 mg by mouth daily as needed. As needed for allergies     No current facility-administered medications on file prior to visit.    Allergies  Allergen Reactions  . Sulfa Antibiotics Hives and Shortness Of Breath   Family History  Problem Relation Age of Onset  . Cancer Mother   . Diabetes Mother   . Anemia Unknown   . COPD Unknown   . Depression Unknown   . Diabetes type II Unknown   . Fibromyalgia Unknown   . Hypertension Unknown   . Thyroid disease Unknown   . Diabetes Father    PE: BP 114/72   Pulse 83   Ht  (1.626 m)   Wt 234 lb 6.4 oz (106.3 kg)   SpO2 97%   BMI 40.23 kg/m  Wt Readings from Last 3 Encounters:  07/13/17 234 lb 6.4 oz (106.3 kg)  01/13/17 243 lb (110.2 kg)  07/13/16 246 lb (111.6 kg)   Constitutional: overweight, in NAD Eyes: PERRLA, EOMI, no exophthalmos ENT: moist  mucous membranes, no thyromegaly, no cervical lymphadenopathy Cardiovascular: RRR, No MRG Respiratory: CTA B Gastrointestinal: abdomen soft, NT, ND, BS+ Musculoskeletal: no deformities, strength intact in all 4 Skin: moist, warm, no rashes Neurological: no tremor with outstretched hands, DTR normal in all 4  ASSESSMENT: 1. Hashimoto's Hypothyroidism  2. Low Vit B12   3. Vit D insufficiency  4.  Partially primary empty sella  PLAN:  1. Patient with long-standing hypothyroidism, on levothyroxine  therapy + selenium supplementation started after her dx of  Hashimoto's thyroiditis - latest thyroid labs reviewed with pt >> normal, but higher in the normal range, after which we increase her levothyroxine from 50 to 75 mcg daily Lab Results  Component Value Date   TSH 4.00 01/18/2017  - she continues on LT4 75 mcg daily - pt feels good on this dose. - we discussed about taking the thyroid hormone every day, with water, >30 minutes before breakfast, separated by >4 hours from acid reflux medications, calcium, iron, multivitamins. Pt. is taking it correctly. - will check thyroid tests today: TSH and fT4 - If labs are abnormal, she will need to return for repeat TFTs in 1.5 months  2. Low Vit B12  -Previously on B12 injections per PCP, with latest level being >1500.   -At last visit I recommended to switch to p.o. B12 5000 mcg daily -We will recheck level today  3. Vit D insufficiency -Last level was normal on 4000 units vitamin D daily  -we will continue this dose -We will recheck level now  4. Empty sella -This is primary, partial -Reviewed together her latest investigation: All pituitary hormones were normal at last visit -No further investigation is necessary  5.  Prediabetes -Recent diagnosis -Discussed that this is reversible, provided she improves her diet and loses weight.  She already started to lose weight: -9 pounds since last visit -We will recheck her HbA1c  today  Needs refills of LT4.  Component     Latest Ref Rng & Units 07/13/2017  T4,Free(Direct)     0.60 - 1.60 ng/dL 1.61  TSH     0.96 - 0.45 uIU/mL 2.80  Hemoglobin A1C     4.6 - 6.5 % 5.5  VITD     30.00 - 100.00 ng/mL 28.55 (L)  Vitamin B12     211 - 911 pg/mL >1500 (H)   Thyroid tests are now normal, we can continue with the current levothyroxine dose.. Vitamin B12 is still high, I will advise her to space out to the vitamin B12 to twice a week. Vitamin D is slightly low, will increase the dose to 5000 units daily.  Carlus Pavlov, MD PhD [facility]

## 2017-12-12 ENCOUNTER — Encounter: Payer: Self-pay | Admitting: Internal Medicine

## 2017-12-29 ENCOUNTER — Encounter: Payer: Self-pay | Admitting: Internal Medicine

## 2017-12-30 ENCOUNTER — Telehealth: Payer: Self-pay | Admitting: Internal Medicine

## 2017-12-30 ENCOUNTER — Encounter: Payer: Self-pay | Admitting: Internal Medicine

## 2017-12-30 NOTE — Telephone Encounter (Signed)
Patient stated she sent a my chart message but have not heard back from anyone. She is needing to know if she needs labs drawn today and needed to know this before 2 o'clock    Pt stated that a detailed message on her voicemail is okay also.

## 2017-12-30 NOTE — Telephone Encounter (Signed)
Odd the MyChart message is not showing up in my in basket.  Dr Elvera Lennox,  I found out yesterday that I am pregnant and wanted to follow up with you. I have had irregular periods this year and have not had a normal one since July. Do I need to make an appointment to see you sooner? Also, my OB is checking my HCG levels again tomorrow. Should I ask them to test my thyroid as well? Is there anything else that needs to be checked? I hope you get this soon. I've tried to reach the office by phone several times and can not get through. My OB is at [facility] so I believe they use the same [facility]. If not I can request them to fax you all of the lab results.  Thanks,  [employer]

## 2018-01-13 ENCOUNTER — Ambulatory Visit: Payer: BLUE CROSS/BLUE SHIELD | Admitting: Internal Medicine

## 2018-01-13 ENCOUNTER — Encounter: Payer: Self-pay | Admitting: Internal Medicine

## 2018-01-13 VITALS — BP 128/70 | HR 75 | Ht 64.0 in | Wt 252.0 lb

## 2018-01-13 DIAGNOSIS — R7303 Prediabetes: Secondary | ICD-10-CM

## 2018-01-13 DIAGNOSIS — E230 Hypopituitarism: Secondary | ICD-10-CM | POA: Diagnosis not present

## 2018-01-13 DIAGNOSIS — E038 Other specified hypothyroidism: Secondary | ICD-10-CM | POA: Diagnosis not present

## 2018-01-13 DIAGNOSIS — E559 Vitamin D deficiency, unspecified: Secondary | ICD-10-CM | POA: Diagnosis not present

## 2018-01-13 DIAGNOSIS — E063 Autoimmune thyroiditis: Secondary | ICD-10-CM

## 2018-01-13 DIAGNOSIS — R7989 Other specified abnormal findings of blood chemistry: Secondary | ICD-10-CM

## 2018-01-13 DIAGNOSIS — E538 Deficiency of other specified B group vitamins: Secondary | ICD-10-CM

## 2018-01-13 LAB — POCT GLYCOSYLATED HEMOGLOBIN (HGB A1C): Hemoglobin A1C: 5.4 % (ref 4.0–5.6)

## 2018-01-13 NOTE — Progress Notes (Signed)
Patient ID: Melissa Mckay, female   DOB: 11-Mar-1980, 37 y.o.   MRN: [id number]   HPI  Melissa Mckay is a 37 y.o.-year-old female, returning for follow-up for Hashimoto's hypothyroidism, prediabetes, vitamin D and B12 insufficiency. Last visit 6 months ago. She is now pregnant 7-[redacted] weeks along (she will find out exactly how far along by ultrasound tomorrow). PCP: Dr Julie [place] [facility]) - [facility]  Partially empty sella:  Incidentally found during investigation for headaches.  Reviewed pituitary work-up performed a year ago-normal: Component of     Latest Ref Rng & Units 01/18/2017  IGF-I, LC/MS     53 - 331 ng/mL 124  Z-Score (Female)     -2.0 - 2 SD -0.3  C206 ACTH     6 - 50 pg/mL 22  Cortisol, Plasma     ug/dL 40.9  LH     mIU/mL 8.11  FSH     mIU/ML 4.0   Hypothyroidism: - [facility] 2007-2008 by [facility] >> started on levothyroxine 50 mcg daily  Patient was on levothyroxine 75 mcg daily before her pregnancy and we increased to approximately 96 mcg found out that she was pregnant.  She is on this dose now for 2 weeks.  Pt takes the levothyroxine - in am - fasting - at least 30 min from b'fast - no Ca, Fe, PPIs - + Prenatal multivitamins at night - not on Biotin - on Selenium  Review TFTs: 12/30/2017: TSH was 4.040 and my T4 is 1.16 -at OB/GYN's office, after which we increased her levothyroxine dose Lab Results  Component Value Date   TSH 2.80 07/13/2017   TSH 4.00 01/18/2017   TSH 2.76 07/13/2016   TSH 1.40 01/14/2016   TSH 2.22 09/20/2015   FREET4 0.94 07/13/2017   FREET4 0.78 01/18/2017   FREET4 0.83 07/13/2016   FREET4 1.10 01/14/2016   FREET4 0.69 09/20/2015   T3FREE 2.7 09/20/2015   Thyroid antibodies were high, but improved on selenium 200 mcg daily: Component     Latest Ref Rng & Units 01/14/2016  Thyroperoxidase Ab SerPl-aCnc     <9 IU/mL 143 (H)   Component     Latest Ref Rng & Units 09/20/2015  Thyroglobulin Ab     <2  IU/mL 1  Thyroperoxidase Ab SerPl-aCnc     <9 IU/mL 245 (H)   Pt denies: - feeling nodules in neck - hoarseness - dysphagia - choking - SOB with lying down No FH of thyroid cancer. No FH of thyroid cancer. No h/o radiation tx to head or neck.  No herbal supplements. No Biotin use. No recent steroids use.   Prediabetes:  Reviewed levels: Lab Results  Component Value Date   HGBA1C 5.5 07/13/2017   HGBA1C 5.4 09/20/2015   vit D deficiency:  Reviewed levels: Lab Results  Component Value Date   VD25OH 28.55 (L) 07/13/2017   VD25OH 31.25 07/13/2016   VD25OH 22.54 (L) 01/14/2016   VD25OH 26.46 (L) 09/20/2015   She takes vitamin D 5000 units daily, increased from 4000 units daily at last OV.  She missed the medication in the last 2 weeks.  B12 deficiency:  Reviewed levels: Lab Results  Component Value Date   VITAMINB12 >1500 (H) 07/13/2017   VITAMINB12 610 07/13/2016   VITAMINB12 282 09/20/2015   Previously on B12 injections, then on 5000 mcg daily, now on 5000 mcg 2x a week, changed at last OV.  She missed the medication in the last 2 weeks.  She has a daughter born prematurely in 2016 - mother's contracting CMV >> now special-needs child (cerebral palsy).  She has headaches and sees neurology for this.  She is on Elavil.  ROS: Constitutional: + weight gain/no weight loss, no fatigue, no subjective hyperthermia, no subjective hypothermia Eyes: no blurry vision, no xerophthalmia ENT: no sore throat, + see HPI Cardiovascular: no CP/no SOB/no palpitations/no leg swelling Respiratory: no cough/no SOB/no wheezing Gastrointestinal: + N/+ V/no D/no C/no acid reflux Musculoskeletal: no muscle aches/no joint aches Skin: no rashes, no hair loss Neurological: + tremors/no numbness/no tingling/no dizziness  I reviewed pt's medications, allergies, PMH, social hx, family hx, and changes were documented in the history of present illness. Otherwise, unchanged from my initial  visit note.  Past Medical History:  Diagnosis Date  . Asthma   . Chlamydia   . Genital HSV   . Hypothyroidism   . Normal pregnancy 09/10/2010  . Pregnancy induced hypertension 2012   preE G1   Past Surgical History:  Procedure Laterality Date  . 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 CESAREAN SECTION N/A 07/16/2014   Procedure: CESAREAN SECTION;  Surgeon: Lavina Hammanodd Meisinger, MD;  Location: [facility];  Service: Obstetrics;  Laterality: N/A;   Social History   Social History  . Marital Status: Married    Spouse Name: N/A  . Number of Children: 2   Occupational History  . [employer], caregiver   Social History Main Topics  . Smoking status: Former Games developermoker  . Smokeless tobacco: Not on file  . Alcohol Use: No  . Drug Use: No   Current Outpatient Medications on File Prior to Visit  Medication Sig Dispense Refill  . albuterol (PROVENTIL HFA;VENTOLIN HFA) 108 (90 BASE) MCG/ACT inhaler Inhale 2 puffs into the lungs every 6 (six) hours as needed for wheezing or shortness of breath. Reported on 09/20/2015    . cetirizine (ZYRTEC) 10 MG tablet Take 10 mg by mouth daily.    . hydrochlorothiazide (HYDRODIURIL) 25 MG tablet Take 25 mg daily by mouth.  3  . levothyroxine (SYNTHROID, LEVOTHROID) 75 MCG tablet Take 1 tablet (75 mcg total) by mouth daily. 90 tablet 3  . losartan (COZAAR) 50 MG tablet Take 50 mg by mouth.    . mometasone-formoterol (DULERA) 200-5 MCG/ACT AERO Dulera 200 mcg-5 mcg/actuation HFA aerosol inhaler    . montelukast (SINGULAIR) 10 MG tablet Take 10 mg by mouth daily as needed. As needed for allergies     No current facility-administered medications on file prior to visit.    Allergies  Allergen Reactions  . Sulfa Antibiotics Hives and Shortness Of Breath   Family History  Problem Relation Age of Onset  . Cancer Mother   . Diabetes Mother   . Anemia Unknown   . COPD Unknown   . Depression Unknown   . Diabetes type II Unknown   .  Fibromyalgia Unknown   . Hypertension Unknown   . Thyroid disease Unknown   . Diabetes Father    PE: BP 128/70   Pulse 75   Ht 5\' 4"  (1.626 m)   Wt 252 lb (114.3 kg)   SpO2 96%   BMI 43.26 kg/m  Wt Readings from Last 3 Encounters:  01/13/18 252 lb (114.3 kg)  07/13/17 234 lb 6.4 oz (106.3 kg)  01/13/17 243 lb (110.2 kg)   Constitutional: overweight, in NAD Eyes: PERRLA, EOMI, no exophthalmos ENT: moist mucous membranes, no thyromegaly, no cervical lymphadenopathy Cardiovascular:  RRR, No MRG Respiratory: CTA B Gastrointestinal: abdomen soft, NT, ND, BS+ Musculoskeletal: no deformities, strength intact in all 4 Skin: moist, warm, no rashes Neurological: + tremor with outstretched hands, DTR normal in all 4  ASSESSMENT: 1. Hashimoto's Hypothyroidism  2. Low Vit B12   3. Vit D insufficiency  4.  Partially primary empty sella  5.  Prediabetes  PLAN:  1. Patient with long-standing Hashimoto's hypothyroidism, on levothyroxine therapy.  She is now pregnant and we increased her levothyroxine by adding 2 tablets a week 2 weeks ago. - latest thyroid labs reviewed with pt >> normal  - she continues on LT4 75 mcg daily + 2 extra tablets a week (equivalent of 96 mcg LT4 daily) - pt feels good on this dose. - we discussed about taking the thyroid hormone every day, with water, >30 minutes before breakfast, separated by >4 hours from acid reflux medications, calcium, iron, multivitamins. Pt. is taking it correctly. - will check thyroid tests in 2 weeks: TSH and fT4 - If labs are abnormal, she will need to return for repeat TFTs in 1.5 months  2. Low Vit B12  -Previously on B12 injections per PCP -Currently on 5000 mcg twice a week, decreased from daily at last visit as her vitamin B12 level was high -she missed the medication in the last 2 weeks >> advised to restart at least once a week -We will recheck her level in 2 weeks  3. Vit D insufficiency -On 5000 units vitamin D,  increased from 4000 units at last visit, when vitamin D was slightly low, 28.55 -she missed the medication in the last 2 weeks >> advised to restart -We will recheck her levels in 2 weeks.  4. Empty sella -primary, partial -All pituitary hormones were normal at last check -No further investigation necessary  5.  Prediabetes -fairly recent dx -Latest HbA1c was 5.5%, excellent -HbA1c today: 5.4% (normal) -I do not feel that she needs to start checking sugars at home  Orders Placed This Encounter  Procedures  . TSH  . T4, free  . Vitamin B12  . Vitamin D, 25-hydroxy   Carlus Pavlov, MD PhD [facility]

## 2018-01-13 NOTE — Patient Instructions (Signed)
Please continue 96 mcg Levothyroxine daily (2 extra tablets of 75 mcg a week).  Take the thyroid hormone every day, with water, at least 30 minutes before breakfast, separated by at least 4 hours from: - acid reflux medications - calcium - iron - multivitamins  Please come back for labs in 2 weeks.  Stop Selenium.

## 2018-01-13 NOTE — Addendum Note (Signed)
Addended by: Darliss RidgelONGER, Kharter Sestak I on: 01/13/2018 04:01 PM   Modules accepted: Orders

## 2018-01-14 ENCOUNTER — Encounter (HOSPITAL_COMMUNITY): Payer: Self-pay

## 2018-02-01 ENCOUNTER — Encounter (HOSPITAL_COMMUNITY): Payer: Self-pay | Admitting: *Deleted

## 2018-02-02 ENCOUNTER — Encounter (HOSPITAL_COMMUNITY): Payer: Self-pay

## 2018-02-02 ENCOUNTER — Ambulatory Visit (HOSPITAL_COMMUNITY)
Admission: RE | Admit: 2018-02-02 | Discharge: 2018-02-02 | Disposition: A | Discharge status: Home / Self Care | Payer: BLUE CROSS/BLUE SHIELD | Source: Ambulatory Visit | Attending: Obstetrics and Gynecology | Admitting: Obstetrics and Gynecology

## 2018-02-02 ENCOUNTER — Ambulatory Visit (HOSPITAL_COMMUNITY): Admission: RE | Admit: 2018-02-02 | Payer: BLUE CROSS/BLUE SHIELD | Source: Ambulatory Visit

## 2018-02-02 ENCOUNTER — Other Ambulatory Visit (HOSPITAL_COMMUNITY): Payer: Self-pay

## 2018-02-02 ENCOUNTER — Ambulatory Visit (HOSPITAL_BASED_OUTPATIENT_CLINIC_OR_DEPARTMENT_OTHER)
Admission: RE | Admit: 2018-02-02 | Discharge: 2018-02-02 | Disposition: A | Discharge status: Home / Self Care | Payer: BLUE CROSS/BLUE SHIELD | Source: Ambulatory Visit | Attending: Maternal & Fetal Medicine | Admitting: Maternal & Fetal Medicine

## 2018-02-02 ENCOUNTER — Other Ambulatory Visit (HOSPITAL_COMMUNITY): Payer: Self-pay | Admitting: *Deleted

## 2018-02-02 DIAGNOSIS — O10011 Pre-existing essential hypertension complicating pregnancy, first trimester: Secondary | ICD-10-CM | POA: Insufficient documentation

## 2018-02-02 DIAGNOSIS — Z6791 Unspecified blood type, Rh negative: Secondary | ICD-10-CM | POA: Insufficient documentation

## 2018-02-02 DIAGNOSIS — O99111 Other diseases of the blood and blood-forming organs and certain disorders involving the immune mechanism complicating pregnancy, first trimester: Secondary | ICD-10-CM | POA: Diagnosis not present

## 2018-02-02 DIAGNOSIS — O3680X0 Pregnancy with inconclusive fetal viability, not applicable or unspecified: Secondary | ICD-10-CM

## 2018-02-02 DIAGNOSIS — R768 Other specified abnormal immunological findings in serum: Secondary | ICD-10-CM

## 2018-02-02 DIAGNOSIS — O09521 Supervision of elderly multigravida, first trimester: Secondary | ICD-10-CM | POA: Insufficient documentation

## 2018-02-02 DIAGNOSIS — O98511 Other viral diseases complicating pregnancy, first trimester: Secondary | ICD-10-CM

## 2018-02-02 DIAGNOSIS — O99211 Obesity complicating pregnancy, first trimester: Secondary | ICD-10-CM | POA: Insufficient documentation

## 2018-02-02 DIAGNOSIS — O34219 Maternal care for unspecified type scar from previous cesarean delivery: Secondary | ICD-10-CM

## 2018-02-02 DIAGNOSIS — O26893 Other specified pregnancy related conditions, third trimester: Secondary | ICD-10-CM | POA: Insufficient documentation

## 2018-02-02 DIAGNOSIS — Z3A09 9 weeks gestation of pregnancy: Secondary | ICD-10-CM | POA: Insufficient documentation

## 2018-02-02 DIAGNOSIS — O09211 Supervision of pregnancy with history of pre-term labor, first trimester: Secondary | ICD-10-CM | POA: Diagnosis not present

## 2018-02-02 DIAGNOSIS — O99281 Endocrine, nutritional and metabolic diseases complicating pregnancy, first trimester: Secondary | ICD-10-CM | POA: Insufficient documentation

## 2018-02-02 DIAGNOSIS — B009 Herpesviral infection, unspecified: Secondary | ICD-10-CM | POA: Diagnosis not present

## 2018-02-02 DIAGNOSIS — O09291 Supervision of pregnancy with other poor reproductive or obstetric history, first trimester: Secondary | ICD-10-CM

## 2018-02-02 DIAGNOSIS — E039 Hypothyroidism, unspecified: Secondary | ICD-10-CM | POA: Diagnosis not present

## 2018-02-03 LAB — CMV ANTIBODY, IGG (EIA): CMV Ab - IgG: >10 U/mL — ABNORMAL HIGH (ref 0.00–0.59)

## 2018-02-03 LAB — CMV DNA, QUANTITATIVE, PCR
CMV DNA Quant: NEGATIVE IU/mL
Log10 CMV Qn DNA Pl: UNDETERMINED log10 IU/mL

## 2018-02-03 LAB — CMV IGM: CMV IGM: 56.8 [AU]/ml — AB (ref 0.0–29.9)

## 2018-02-07 NOTE — Progress Notes (Signed)
Consult  Date of service 02/02/2018 Referring provider: Lavina Hammanodd Meisinger, MD Reason for referral: CMV IgM antibodies prior child with CMV infection  Melissa Mckay is a 37 yo G3P2 at who is here in consultation at 5510 w 0d with regard to recent concerns for CMV reactivation.  She is dated by a LMP and consistent with first trimester US with EDD of 09/05/18  She is overall doing well she denies fever, chills or malaise. She denies vaginal bleeding or loss of fluid.  Her prior pregnancy resulted with a CMV neonatal infection.ANA. In 2016 she recalls becoming ill. She had a work up that included rule out of lupus and cardiac disease.  At 30 weeks she was not feeling well, there was non-reassuring fetal status. She subsequently had a cesarean delivery and baby Ava was diagnosed with congenital CMV. 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  In review of [name]'s labs she has positive IgM and IgG antibodies and is concerned about a recurrence/reactivcation.  BP 111/69   Pulse 79   Wt 112.5 kg   LMP 11/06/2017   BMI 42.57 kg/m    OB History  Gravida Para Term Preterm AB Living  3 2 1 1  0 1  SAB TAB Ectopic Multiple Live Births  0 0 0 0 1    # Outcome Date GA Lbr Len/2nd Weight Sex Delivery Anes PTL Lv  3 Current           2 Preterm 07/16/14 1065w0d  1330 g F CS-LTranv Spinal       Apgar1: 2  Apgar5: 7  1 Term 09/13/10 7358w2d 11:05 / 00:28 3090 g M Vag-Spont EPI  LIV     Name: Melissa Mckay,BOY Melissa Mckay     Apgar1: 9  Apgar5: 9    Past Medical History:  Diagnosis Date  . Asthma   . Chlamydia   . Genital HSV   . Hypothyroidism   . Normal pregnancy 09/10/2010  . Pregnancy induced hypertension 2012   preE G1    Past Surgical History:  Procedure Laterality Date  . 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 CESAREAN SECTION N/A 07/16/2014   Procedure: CESAREAN SECTION;  Surgeon: Lavina Hammanodd Meisinger, MD;  Location: [facility];  Service: Obstetrics;   Laterality: N/A;    Social History   Socioeconomic History  . Marital status: Married    Spouse name: Not on file  . Number of children: Not on file  . Years of education: Not on file  . Highest education level: Not on file  Occupational History  . Not on file  Social Needs  . Financial resource strain: Not on file  . Food insecurity:    Worry: Not on file    Inability: Not on file  . Transportation needs:    Medical: Not on file    Non-medical: Not on file  Tobacco Use  . Smoking status: Former Games developermoker  . Smokeless tobacco: Never Used  Substance and Sexual Activity  . Alcohol use: No  . Drug use: No  . Sexual activity: Yes    Birth control/protection: None  Lifestyle  . Physical activity:    Days per week: Not on file    Minutes per session: Not on file  . Stress: Not on file  Relationships  . Social connections:    Talks on phone: Not on file    Gets together: Not on file    Attends  religious service: Not on file    Active member of club or organization: Not on file    Attends meetings of clubs or organizations: Not on file    Relationship status: Not on file  . Intimate partner violence:    Fear of current or ex partner: Not on file    Emotionally abused: Not on file    Physically abused: Not on file    Forced sexual activity: Not on file  Other Topics Concern  . Not on file  Social History Narrative  . Not on file    Review of Systems  Constitutional: Negative.   HENT: Negative.   Eyes: Negative.   Respiratory: Negative.   Cardiovascular: Negative.   Gastrointestinal: Negative.   Genitourinary: Negative.   Skin: Negative.   Neurological: Negative.   Endo/Heme/Allergies: Negative.   Psychiatric/Behavioral: Negative.    Imaging/Labs:  10 w 0 d cardiac activity present, no signs of hydrops. IgM 56.8 AU/mL and IgG  >10  (02/02/2018) Quantitative maternal CMV negative.   Impression/Counseling:  +CMV IgM and IgG with prior affected child:  I  discussed with [name] the implications of these positive antibodies. I discussed that CMV can persist in the body after an infection. We discussed the low rates of transmission of CMV in recurrent cases ranging from 0.5 to 1.2 %. We discussed the IgM and IgG are most concerning. Typically recurrent infections are diagnosed with a four fold increase in the IgG and presence of IgM however, in the literature there cases of persistent IgM elevations. Prior values of the IgG at the end of Ms. Willens pregnancy or more recent values were not available to me at the time of the consultation.   [name] noted that she wanted to do everything that she could but at this would not consider invasive testing that would potentially risk the pregnancy. She would desire expectant management.   At this time the CMV serologies were drawn as noted above.We will add on an avidity IgG which I am expecting to be strongly binding.   We will continue serial growth examinations in the mean time. If there are further finding suggestive of CMV infection we will review the option of invasive testing.  Mr and Mrs. Melissa Mckay are both agree with this plan and will follow up in 4-6 weeks.  I spent 40 minutes with Mrs. Melissa Mckay with >50% in face to face consultation over video.   All questions were answered.      Impression/

## 2018-02-09 ENCOUNTER — Other Ambulatory Visit (HOSPITAL_COMMUNITY): Payer: Self-pay | Admitting: *Deleted

## 2018-02-28 ENCOUNTER — Encounter (HOSPITAL_COMMUNITY): Payer: Self-pay

## 2018-03-02 DIAGNOSIS — R768 Other specified abnormal immunological findings in serum: Secondary | ICD-10-CM

## 2018-03-02 HISTORY — DX: Other specified abnormal immunological findings in serum: R76.8

## 2018-03-03 ENCOUNTER — Other Ambulatory Visit (HOSPITAL_COMMUNITY): Payer: Self-pay | Admitting: *Deleted

## 2018-03-03 ENCOUNTER — Ambulatory Visit (HOSPITAL_COMMUNITY)
Admission: RE | Admit: 2018-03-03 | Discharge: 2018-03-03 | Disposition: A | Discharge status: Home / Self Care | Payer: Medicaid Other | Source: Ambulatory Visit | Attending: Obstetrics and Gynecology | Admitting: Obstetrics and Gynecology

## 2018-03-03 ENCOUNTER — Encounter (HOSPITAL_COMMUNITY): Payer: Self-pay

## 2018-03-03 DIAGNOSIS — O3680X0 Pregnancy with inconclusive fetal viability, not applicable or unspecified: Secondary | ICD-10-CM | POA: Diagnosis not present

## 2018-03-03 DIAGNOSIS — O34219 Maternal care for unspecified type scar from previous cesarean delivery: Secondary | ICD-10-CM

## 2018-03-03 DIAGNOSIS — O98511 Other viral diseases complicating pregnancy, first trimester: Secondary | ICD-10-CM

## 2018-03-03 DIAGNOSIS — O10011 Pre-existing essential hypertension complicating pregnancy, first trimester: Secondary | ICD-10-CM

## 2018-03-03 DIAGNOSIS — O360110 Maternal care for anti-D [Rh] antibodies, first trimester, not applicable or unspecified: Secondary | ICD-10-CM

## 2018-03-03 DIAGNOSIS — O09211 Supervision of pregnancy with history of pre-term labor, first trimester: Secondary | ICD-10-CM

## 2018-03-03 DIAGNOSIS — O285 Abnormal chromosomal and genetic finding on antenatal screening of mother: Secondary | ICD-10-CM

## 2018-03-03 DIAGNOSIS — Z3A13 13 weeks gestation of pregnancy: Secondary | ICD-10-CM

## 2018-03-03 DIAGNOSIS — O09291 Supervision of pregnancy with other poor reproductive or obstetric history, first trimester: Secondary | ICD-10-CM

## 2018-03-03 DIAGNOSIS — O09521 Supervision of elderly multigravida, first trimester: Secondary | ICD-10-CM

## 2018-03-08 ENCOUNTER — Other Ambulatory Visit (HOSPITAL_COMMUNITY): Payer: Self-pay | Admitting: Obstetrics and Gynecology

## 2018-03-15 ENCOUNTER — Encounter: Payer: Self-pay | Admitting: Internal Medicine

## 2018-03-15 ENCOUNTER — Other Ambulatory Visit (HOSPITAL_COMMUNITY): Payer: Self-pay | Admitting: Maternal & Fetal Medicine

## 2018-03-18 LAB — TSH: TSH: 2.44 (ref <=5.90)

## 2018-03-22 ENCOUNTER — Encounter: Payer: Self-pay | Admitting: Internal Medicine

## 2018-03-22 NOTE — Telephone Encounter (Signed)
Lab results are in CareEverywhere Tab under labs, [facility].  TSH 2.436 T4 0.9

## 2018-03-24 ENCOUNTER — Encounter: Payer: Self-pay | Admitting: Internal Medicine

## 2018-04-21 ENCOUNTER — Encounter (HOSPITAL_COMMUNITY): Payer: Self-pay

## 2018-04-21 ENCOUNTER — Ambulatory Visit (HOSPITAL_COMMUNITY): Admission: RE | Admit: 2018-04-21 | Payer: BLUE CROSS/BLUE SHIELD | Source: Ambulatory Visit

## 2018-04-29 ENCOUNTER — Ambulatory Visit (INDEPENDENT_AMBULATORY_CARE_PROVIDER_SITE_OTHER): Payer: BLUE CROSS/BLUE SHIELD | Admitting: Internal Medicine

## 2018-04-29 ENCOUNTER — Encounter: Payer: Self-pay | Admitting: Internal Medicine

## 2018-04-29 ENCOUNTER — Other Ambulatory Visit: Payer: Self-pay

## 2018-04-29 VITALS — BP 130/80 | HR 86 | Ht 64.0 in | Wt 263.0 lb

## 2018-04-29 DIAGNOSIS — E538 Deficiency of other specified B group vitamins: Secondary | ICD-10-CM | POA: Diagnosis not present

## 2018-04-29 DIAGNOSIS — E230 Hypopituitarism: Secondary | ICD-10-CM | POA: Diagnosis not present

## 2018-04-29 DIAGNOSIS — E559 Vitamin D deficiency, unspecified: Secondary | ICD-10-CM | POA: Diagnosis not present

## 2018-04-29 DIAGNOSIS — E038 Other specified hypothyroidism: Secondary | ICD-10-CM | POA: Diagnosis not present

## 2018-04-29 DIAGNOSIS — R7303 Prediabetes: Secondary | ICD-10-CM

## 2018-04-29 DIAGNOSIS — E063 Autoimmune thyroiditis: Secondary | ICD-10-CM | POA: Diagnosis not present

## 2018-04-29 LAB — POCT GLYCOSYLATED HEMOGLOBIN (HGB A1C): Hemoglobin A1C: 5.8 % — AB (ref 4.0–5.6)

## 2018-04-29 NOTE — Progress Notes (Signed)
Patient ID: Melissa Mckay, female   DOB: [birth year], 38 y.o.   MRN: [id number]   HPI  Melissa Mckay is a 38 y.o.-year-old female, returning for follow-up for Hashimoto's hypothyroidism, prediabetes, vitamin D and B12 insufficiency. Last visit 3.5 months ago. She is now pregnant [redacted] weeks along - she will have a son. PCP: Dr Julie [place] [facility] [place]  She had an episode of pounding HA, and also cold intolerance - x4 weeks, no resolved.  Partially empty sella:  Incidentally found during investigation for headaches.  Reviewed pituitary work-up-normal: Component of     Latest Ref Rng & Units 01/18/2017  IGF-I, LC/MS     53 - 331 ng/mL 124  Z-Score (Female)     -2.0 - 2 SD -0.3  C206 ACTH     6 - 50 pg/mL 22  Cortisol, Plasma     ug/dL 78.2  LH     mIU/mL 9.56  FSH     mIU/ML 4.0   Hypothyroidism: - [facility] 2007-2008 by [facility] >> started on 50 mcg daily.  Patient was on levothyroxine 75 mcg daily before her pregnancy and we increased to approximately 96 mcg when she found out that she was pregnant.  At last check, TFTs were at goal.  Pt is on levothyroxine 96 Mcg daily, taken: - in am - fasting - at least 1 hour from b'fast - no Ca, Fe, PPIs - + Prenatal vitamins at night -only takes them approximately twice a week due to nausea - not on Biotin -Also on selenium -but not now during the pregnancy  Review TFTs: Lab Results  Component Value Date   TSH 2.44 03/18/2018   TSH 2.80 07/13/2017   TSH 4.00 01/18/2017   TSH 2.76 07/13/2016   TSH 1.40 01/14/2016   TSH 2.22 09/20/2015   FREET4 0.94 07/13/2017   FREET4 0.78 01/18/2017   FREET4 0.83 07/13/2016   FREET4 1.10 01/14/2016   FREET4 0.69 09/20/2015   T3FREE 2.7 09/20/2015  12/30/2017: TSH was 4.040 and my T4 is 1.16 -at OB/GYN's office, after which we increased her levothyroxine dose  Thyroid antibodies were high, but they improved on selenium 200 mcg daily - now off Selenium during the  pregnancy: Component     Latest Ref Rng & Units 01/14/2016  Thyroperoxidase Ab SerPl-aCnc     <9 IU/mL 143 (H)   Component     Latest Ref Rng & Units 09/20/2015  Thyroglobulin Ab     <2 IU/mL 1  Thyroperoxidase Ab SerPl-aCnc     <9 IU/mL 245 (H)   Pt denies: - feeling nodules in neck - hoarseness - dysphagia - choking - SOB with lying down  No FH of thyroid cancer. No h/o radiation tx to head or neck.   No herbal supplements. No Biotin use. No recent steroids use.   Prediabetes:  Reviewed latest HbA1c levels-excellent: Lab Results  Component Value Date   HGBA1C 5.4 01/13/2018   HGBA1C 5.5 07/13/2017   HGBA1C 5.4 09/20/2015   vit D deficiency:  Reviewed levels: Lab Results  Component Value Date   VD25OH 28.55 (L) 07/13/2017   VD25OH 31.25 07/13/2016   VD25OH 22.54 (L) 01/14/2016   VD25OH 26.46 (L) 09/20/2015   She is on vitamin D 5000 units daily, at last visit, she was off the medication for 2 weeks.  I advised her to restart, but she did not start >> on Prenatal vitamins only - but not consistent with these, either  b/c nausea.  B12 deficiency:  Reviewed levels: Lab Results  Component Value Date   VITAMINB12 >1500 (H) 07/13/2017   VITAMINB12 610 07/13/2016   VITAMINB12 282 09/20/2015   She was previously on B12 injections, then on 5000 mcg daily, and at last visit 5000 mcg twice a week.  However, she was off the supplement for 2 weeks at that time and I advised her to restart. She did not restart. On prenatal vitamins only.  She has a daughter born prematurely in 2016 - mother's contracting CMV >> now special-needs child (cerebral palsy).  She has headaches and sees neurology for this.  She is on Elavil.  ROS: Constitutional: + weight gain/no weight loss, no fatigue, no subjective hyperthermia, + resolved subjective hypothermia Eyes: no blurry vision, no xerophthalmia ENT: no sore throat, + see HPI Cardiovascular: no CP/no SOB/no palpitations/no leg  swelling Respiratory: no cough/no SOB/no wheezing Gastrointestinal: no N/no V/no D/no C/no acid reflux Musculoskeletal: no muscle aches/no joint aches Skin: no rashes, no hair loss Neurological: no tremors/no numbness/no tingling/no dizziness  I reviewed pt's medications, allergies, PMH, social hx, family hx, and changes were documented in the history of present illness. Otherwise, unchanged from my initial visit note.  Past Medical History:  Diagnosis Date  . Asthma   . Chlamydia   . Genital HSV   . Hypothyroidism   . Normal pregnancy 09/10/2010  . Pregnancy induced hypertension 2012   preE G1   Past Surgical History:  Procedure Laterality Date  . 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 CESAREAN SECTION N/A 07/16/2014   Procedure: CESAREAN SECTION;  Surgeon: Lavina Hamman, MD;  Location: [facility];  Service: Obstetrics;  Laterality: N/A;   Social History   Social History  . Marital Status: Married    Spouse Name: N/A  . Number of Children: 2   Occupational History  . [employer], caregiver   Social History Main Topics  . Smoking status: Former Games developer  . Smokeless tobacco: Not on file  . Alcohol Use: No  . Drug Use: No   Current Outpatient Medications on File Prior to Visit  Medication Sig Dispense Refill  . acetaminophen (TYLENOL) 325 MG tablet Take 650 mg by mouth every 6 (six) hours as needed.    Marland Kitchen albuterol (PROVENTIL HFA;VENTOLIN HFA) 108 (90 BASE) MCG/ACT inhaler Inhale 2 puffs into the lungs every 6 (six) hours as needed for wheezing or shortness of breath. Reported on 09/20/2015    . azithromycin (ZITHROMAX) 250 MG tablet Take by mouth daily.    . cetirizine (ZYRTEC) 10 MG tablet Take 10 mg by mouth daily.    . Fluticasone Propionate (FLONASE NA) Place into the nose.    . hydrochlorothiazide (HYDRODIURIL) 25 MG tablet Take 25 mg daily by mouth.  3  . levothyroxine (SYNTHROID, LEVOTHROID) 75 MCG tablet Take 1 tablet (75 mcg total) by mouth  daily. 90 tablet 3  . Loratadine (CLARITIN PO) Take by mouth.    . losartan (COZAAR) 50 MG tablet Take 50 mg by mouth.    . mometasone-formoterol (DULERA) 200-5 MCG/ACT AERO Dulera 200 mcg-5 mcg/actuation HFA aerosol inhaler    . montelukast (SINGULAIR) 10 MG tablet Take 10 mg by mouth daily as needed. As needed for allergies    . Oseltamivir Phosphate (TAMIFLU PO) Take by mouth.    . Prenatal Vit-Fe Fumarate-FA (PRENATAL VITAMIN PO) Take by mouth.     No current facility-administered medications on file prior to  visit.    Allergies  Allergen Reactions  . Sulfa Antibiotics Hives and Shortness Of Breath   Family History  Problem Relation Age of Onset  . Cancer Mother   . Diabetes Mother   . Anemia Other   . COPD Other   . Depression Other   . Diabetes type II Other   . Fibromyalgia Other   . Hypertension Other   . Thyroid disease Other   . Diabetes Father    PE: BP 130/80   Pulse 86   Ht 5\' 4"  (1.626 m)   Wt 263 lb (119.3 kg)   LMP 11/06/2017   SpO2 98%   BMI 45.14 kg/m  Wt Readings from Last 3 Encounters:  04/29/18 263 lb (119.3 kg)  03/03/18 255 lb 9.6 oz (115.9 kg)  02/02/18 248 lb (112.5 kg)   Constitutional: overweight, in NAD Eyes: PERRLA, EOMI, no exophthalmos ENT: moist mucous membranes, no thyromegaly, no cervical lymphadenopathy Cardiovascular: RRR, No MRG Respiratory: CTA B Gastrointestinal: abdomen soft, NT, ND, BS+ Musculoskeletal: no deformities, strength intact in all 4 Skin: moist, warm, no rashes Neurological: + Tremor with outstretched hands, DTR normal in all 4  ASSESSMENT: 1. Hashimoto's Hypothyroidism  2. Low Vit B12   3. Vit D insufficiency  4.  Partially primary empty sella  5.  Prediabetes  PLAN:  1. Patient with longstanding Hashimoto's hypothyroidism, on levothyroxine therapy.  She is pregnant and during pregnancy we increase her levothyroxine by adding 2 extra tablets a week. - latest thyroid labs reviewed with pt >> normal and  at goal for pregnancy 03/2018 - she continues on LT4 75 mcg daily + 2 extra tablets a week (equivalent of 96 mcg LT4 daily).  I advised her to change back to 75 mcg daily after she gives birth. - pt feels good on this dose. - we discussed about taking the thyroid hormone every day, with water, >30 minutes before breakfast, separated by >4 hours from acid reflux medications, calcium, iron, multivitamins. Pt. is taking it correctly. - will check thyroid tests today: TSH and fT4 - If labs are abnormal, she will need to return for repeat TFTs in 1 month - Otherwise, we will recheck them approximately 1 month after she gives birth  2. Low Vit B12  -Previously on B12 injections per PCP -At last visit, she was on 5000 mcg twice a week, but was off the medication for 2 weeks.  I advised her to restart, but she did not, only continued prenatal vitamins.  She takes these sporadically. -Recheck B12 level today  3. Vit D insufficiency -On 5000 units vitamin D, but was off for 2 weeks at last visit.  I advised her to restart,, but she did not, only continued prenatal vitamins-sporadically -We will recheck vitamin D level today  4. Empty sella -Primary, partial -All pituitary hormones were normal -No further investigation is necessary  5.  Prediabetes -Relatively recent diagnosis -Latest HbA1c was excellent, at 5.4% -I did not advise her to start checking sugars at home. - HbA1c slightly 5.8% (higher)  Needs refills.  Component     Latest Ref Rng & Units 04/29/2018  T4,Free(Direct)     0.82 - 1.77 ng/dL 6.94  TSH     8.546 - 2.703 uIU/mL 1.500  Thyroperoxidase Ab SerPl-aCnc     <9 IU/mL 14 (H)  Hemoglobin A1C     4.0 - 5.6 % 5.8 (A)  Vitamin B12     232 - 1,245 pg/mL 365  Vitamin  D, 25-Hydroxy     30.0 - 100.0 ng/mL 23.9 (L)  TPO antibodies are much improved. HbA1c is at goal for pregnancy. TFTs are also at goal for pregnancy. Vitamin D is low.  We will again advised her to start 5000  units vitamin D daily.  Carlus Pavlovristina Lyanne Kates, MD PhD [facility]

## 2018-04-29 NOTE — Patient Instructions (Signed)
Please stop at the lab.  Continue Levothyroxine 75 mcg x9 tablets a week.  Take the thyroid hormone every day, with water, at least 30 minutes before breakfast, separated by at least 4 hours from: - acid reflux medications - calcium - iron - multivitamins  Please return in 5 months.

## 2018-04-30 LAB — VITAMIN B12: Vitamin B-12: 365 pg/mL (ref 232–1245)

## 2018-04-30 LAB — T4, FREE: Free T4: 1.23 ng/dL (ref 0.82–1.77)

## 2018-04-30 LAB — TSH: TSH: 1.5 u[IU]/mL (ref 0.450–4.500)

## 2018-04-30 LAB — VITAMIN D 25 HYDROXY (VIT D DEFICIENCY, FRACTURES): Vit D, 25-Hydroxy: 23.9 ng/mL — ABNORMAL LOW (ref 30.0–100.0)

## 2018-05-02 LAB — THYROID PEROXIDASE ANTIBODY: THYROID PEROXIDASE ANTIBODY: 14 [IU]/mL — AB (ref <9)

## 2018-05-02 MED ORDER — LEVOTHYROXINE SODIUM 75 MCG PO TABS: 75.0000 ug | ORAL_TABLET | Freq: Every day | ORAL | 3 refills | Status: DC

## 2018-06-21 ENCOUNTER — Encounter: Payer: BLUE CROSS/BLUE SHIELD | Attending: Obstetrics and Gynecology | Admitting: *Deleted

## 2018-06-21 ENCOUNTER — Other Ambulatory Visit: Payer: Self-pay

## 2018-06-21 DIAGNOSIS — R7302 Impaired glucose tolerance (oral): Secondary | ICD-10-CM | POA: Diagnosis not present

## 2018-06-21 NOTE — Progress Notes (Signed)
  Patient was seen on 06/21/2018 for Gestational Diabetes self-management. EDD 08/29/2018. Patient states no history of GDM but she has a strong family history. Diet history obtained. Patient eats good variety of all food groups and has recently chosen to omit most carb containing foods at meal time. Beverages include water, coffee with Splenda and almond milk.  The following learning objectives were met by the patient :   States the definition of Gestational Diabetes  States why dietary management is important in controlling blood glucose  Describes the effects of carbohydrates on blood glucose levels  Demonstrates ability to create a balanced meal plan  Demonstrates carbohydrate counting   States when to check blood glucose levels  Demonstrates proper blood glucose monitoring techniques  States the effect of stress and exercise on blood glucose levels  States the importance of limiting caffeine and abstaining from alcohol and smoking  Plan:  Aim for 3 Carb Choices per meal (45 grams) +/- 1 either way  Aim for 1-2 Carbs per snack Begin reading food labels for Total Carbohydrate of foods If OK with your MD, consider  increasing your activity level by walking, Arm Chair Exercises or other activity daily as tolerated Begin checking BG before breakfast and 2 hours after first bite of breakfast, lunch and dinner as directed by MD  Bring Log Book/Sheet and meter to every medical appointment  Take medication if directed by MD  Blood glucose monitor given: Accu Chek Guide Me Lot # [id number] Exp: 04/19/2019 Blood glucose reading: 141 mg/dl  Patient instructed to monitor glucose levels: FBS: 60 - 95 mg/dl 2 hour: <120 mg/dl  Patient received the following handouts:  Nutrition Diabetes and Pregnancy  Carbohydrate Counting List  BG Log Sheet  Patient will be seen for follow-up as needed.

## 2018-06-21 NOTE — Patient Instructions (Signed)
Plan:  Aim for 3 Carb Choices per meal (45 grams) +/- 1 either way  Aim for 0-2 Carbs per snack if hungry  Include protein in moderation with your meals and snacks Consider reading food labels for Total Carbohydrate of foods Consider  increasing your activity level daily as tolerated Consider checking BG as directed pre breakfast and 2 hours after each meal

## 2018-06-30 ENCOUNTER — Other Ambulatory Visit: Payer: Self-pay

## 2018-06-30 ENCOUNTER — Encounter (HOSPITAL_COMMUNITY): Payer: Self-pay

## 2018-06-30 ENCOUNTER — Inpatient Hospital Stay (HOSPITAL_COMMUNITY)
Admission: AD | Admit: 2018-06-30 | Discharge: 2018-06-30 | Disposition: A | Discharge status: Home / Self Care | Payer: BLUE CROSS/BLUE SHIELD | Attending: Obstetrics and Gynecology | Admitting: Obstetrics and Gynecology

## 2018-06-30 DIAGNOSIS — O99283 Endocrine, nutritional and metabolic diseases complicating pregnancy, third trimester: Secondary | ICD-10-CM | POA: Diagnosis not present

## 2018-06-30 DIAGNOSIS — E039 Hypothyroidism, unspecified: Secondary | ICD-10-CM | POA: Insufficient documentation

## 2018-06-30 DIAGNOSIS — O368130 Decreased fetal movements, third trimester, not applicable or unspecified: Secondary | ICD-10-CM | POA: Insufficient documentation

## 2018-06-30 DIAGNOSIS — O34219 Maternal care for unspecified type scar from previous cesarean delivery: Secondary | ICD-10-CM | POA: Diagnosis not present

## 2018-06-30 DIAGNOSIS — Z809 Family history of malignant neoplasm, unspecified: Secondary | ICD-10-CM | POA: Insufficient documentation

## 2018-06-30 DIAGNOSIS — O99513 Diseases of the respiratory system complicating pregnancy, third trimester: Secondary | ICD-10-CM | POA: Diagnosis not present

## 2018-06-30 DIAGNOSIS — Z882 Allergy status to sulfonamides status: Secondary | ICD-10-CM | POA: Insufficient documentation

## 2018-06-30 DIAGNOSIS — Z79899 Other long term (current) drug therapy: Secondary | ICD-10-CM | POA: Diagnosis not present

## 2018-06-30 DIAGNOSIS — J45909 Unspecified asthma, uncomplicated: Secondary | ICD-10-CM | POA: Diagnosis not present

## 2018-06-30 DIAGNOSIS — Z87891 Personal history of nicotine dependence: Secondary | ICD-10-CM | POA: Diagnosis not present

## 2018-06-30 DIAGNOSIS — Z3A30 30 weeks gestation of pregnancy: Secondary | ICD-10-CM | POA: Diagnosis not present

## 2018-06-30 DIAGNOSIS — Z7951 Long term (current) use of inhaled steroids: Secondary | ICD-10-CM | POA: Diagnosis not present

## 2018-06-30 DIAGNOSIS — Z833 Family history of diabetes mellitus: Secondary | ICD-10-CM | POA: Insufficient documentation

## 2018-06-30 DIAGNOSIS — Z836 Family history of other diseases of the respiratory system: Secondary | ICD-10-CM | POA: Diagnosis not present

## 2018-06-30 DIAGNOSIS — Z79890 Hormone replacement therapy (postmenopausal): Secondary | ICD-10-CM | POA: Insufficient documentation

## 2018-06-30 HISTORY — DX: Abnormal immunological findings in specimens from other organs, systems and tissues: R89.4

## 2018-06-30 LAB — URINALYSIS, ROUTINE W REFLEX MICROSCOPIC
Bilirubin Urine: NEGATIVE
Glucose, UA: NEGATIVE mg/dL
Hgb urine dipstick: NEGATIVE
Ketones, ur: 5 mg/dL — AB
Leukocytes,Ua: NEGATIVE
Nitrite: NEGATIVE
Protein, ur: NEGATIVE mg/dL
Specific Gravity, Urine: 1.02 (ref 1.005–1.030)
pH: 6 (ref 5.0–8.0)

## 2018-06-30 NOTE — Discharge Instructions (Signed)

## 2018-06-30 NOTE — MAU Provider Note (Signed)
Chief Complaint:  Decreased Fetal Movement   First Provider Initiated Contact with Patient 06/30/18 2119      HPI: Melissa Mckay is a 38 y.o. G3P1102 at 6130w3dwho presents to maternity admissions reporting decreased fetal movement today.  Tried to listen with Doppler but unable to find FHR.  No pain or bleeding. She denies LOF, vaginal bleeding, vaginal itching/burning, urinary symptoms, h/a, dizziness, n/v, diarrhea, constipation or fever/chills.    RN Note: Pt reports decreased fetal movement today.  Pt reports having a home doppler. Pt states she tried for 1 hour to find the fetal heart rate, but could not.  Reports pelvic pressure. Denies vaginal or LOF.   Past Medical History: Past Medical History:  Diagnosis Date  . Asthma   . Chlamydia   . Genital HSV   . Hypothyroidism   . Normal pregnancy 09/10/2010  . Pregnancy induced hypertension 2012   preE G1    Past obstetric history: OB History  Gravida Para Term Preterm AB Living  3 2 1 1  0 2  SAB TAB Ectopic Multiple Live Births  0 0 0 0 2    # Outcome Date GA Lbr Len/2nd Weight Sex Delivery Anes PTL Lv  3 Current           2 Preterm 07/16/14 2765w0d  1330 g F CS-LTranv Spinal  LIV  1 Term 09/13/10 5429w2d 11:05 / 00:28 3090 g M Vag-Spont EPI  LIV    Past Surgical History: Past Surgical History:  Procedure Laterality Date  . 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 CESAREAN SECTION N/A 07/16/2014   Procedure: CESAREAN SECTION;  Surgeon: Lavina Hammanodd Meisinger, MD;  Location: [facility];  Service: Obstetrics;  Laterality: N/A;    Family History: Family History  Problem Relation Age of Onset  . Cancer Mother   . Diabetes Mother   . Anemia Other   . COPD Other   . Depression Other   . Diabetes type II Other   . Fibromyalgia Other   . Hypertension Other   . Thyroid disease Other   . Diabetes Father     Social History: Social History   Tobacco Use  . Smoking status: Former Games developermoker  .  Smokeless tobacco: Never Used  Substance Use Topics  . Alcohol use: No  . Drug use: No    Allergies:  Allergies  Allergen Reactions  . Sulfa Antibiotics Hives and Shortness Of Breath    Meds:  Medications Prior to Admission  Medication Sig Dispense Refill Last Dose  . acetaminophen (TYLENOL) 325 MG tablet Take 650 mg by mouth every 6 (six) hours as needed.   Taking  . albuterol (PROVENTIL HFA;VENTOLIN HFA) 108 (90 BASE) MCG/ACT inhaler Inhale 2 puffs into the lungs every 6 (six) hours as needed for wheezing or shortness of breath. Reported on 09/20/2015   Taking  . Fluticasone Propionate (FLONASE NA) Place into the nose.   Taking  . levothyroxine (SYNTHROID, LEVOTHROID) 75 MCG tablet Take 1 tablet (75 mcg total) by mouth daily. 90 tablet 3   . mometasone-formoterol (DULERA) 200-5 MCG/ACT AERO Dulera 200 mcg-5 mcg/actuation HFA aerosol inhaler   Taking  . montelukast (SINGULAIR) 10 MG tablet Take 10 mg by mouth daily as needed. As needed for allergies   Taking  . Prenatal Vit-Fe Fumarate-FA (PRENATAL VITAMIN PO) Take by mouth.   Taking    I have reviewed patient's Past Medical Hx, Surgical Hx, Family Hx, Social Hx, medications and  allergies.   ROS:  Review of Systems Other systems negative  Physical Exam   Vitals:   06/30/18 2121 06/30/18 2231  BP: (!) 121/56 115/82  Pulse: 87 91  Resp: 12   Temp: 98.1 F (36.7 C)   SpO2: 100%     Constitutional: Well-developed, well-nourished female in no acute distress.  Cardiovascular: normal rate and rhythm Respiratory: normal effort, clear to auscultation bilaterally GI: Abd soft, non-tender, gravid appropriate for gestational age.   No rebound or guarding. MS: Extremities nontender, no edema, normal ROM Neurologic: Alert and oriented x 4.  GU: Neg CVAT.  PELVIC EXAM: deferred  FHT:  Baseline 140 , moderate variability, accelerations present, no decelerations Contractions: occasional   Labs: Results for orders placed or  performed during the hospital encounter of 06/30/18 (from the past 24 hour(s))  Urinalysis, Routine w reflex microscopic     Status: Abnormal   Collection Time: 06/30/18  9:13 PM  Result Value Ref Range   Color, Urine YELLOW YELLOW   APPearance HAZY (A) CLEAR   Specific Gravity, Urine 1.020 1.005 - 1.030   pH 6.0 5.0 - 8.0   Glucose, UA NEGATIVE NEGATIVE mg/dL   Hgb urine dipstick NEGATIVE NEGATIVE   Bilirubin Urine NEGATIVE NEGATIVE   Ketones, ur 5 (A) NEGATIVE mg/dL   Protein, ur NEGATIVE NEGATIVE mg/dL   Nitrite NEGATIVE NEGATIVE   Leukocytes,Ua NEGATIVE NEGATIVE     Imaging:  No results found.  MAU Course/MDM: I have ordered labs and reviewed results. UA is normal  NST reviewed and has been very reactive throughout NST   DIscussed how to auscultate FHR.  Also discussed fetal movement issues.    Assessment: Decreased fetal movement  Plan: Discharge home Fetal movement monitoring Preterm Labor precautions and fetal kick counts Follow up in Office for prenatal visits and recheck  Encouraged to return here or to other Urgent Care/ED if she develops worsening of symptoms, increase in pain, fever, or other concerning symptoms.   Pt stable at time of discharge.  Wynelle Bourgeois CNM, MSN Certified Nurse-Midwife 06/30/2018 9:20 PM

## 2018-06-30 NOTE — MAU Note (Signed)
Pt reports decreased fetal movement today.   Pt reports having a home doppler. Pt states she tried for 1 hour to find the fetal heart rate, but could not.   Reports pelvic pressure.  Denies vaginal or LOF.

## 2018-07-27 ENCOUNTER — Inpatient Hospital Stay (HOSPITAL_COMMUNITY)
Admission: AD | Admit: 2018-07-27 | Discharge: 2018-07-30 | DRG: 785 | Disposition: A | Discharge status: Home / Self Care | Payer: BLUE CROSS/BLUE SHIELD | Attending: Obstetrics and Gynecology | Admitting: Obstetrics and Gynecology

## 2018-07-27 ENCOUNTER — Encounter (HOSPITAL_COMMUNITY): Payer: Self-pay

## 2018-07-27 ENCOUNTER — Other Ambulatory Visit: Payer: Self-pay

## 2018-07-27 DIAGNOSIS — O1493 Unspecified pre-eclampsia, third trimester: Secondary | ICD-10-CM | POA: Diagnosis not present

## 2018-07-27 DIAGNOSIS — O24424 Gestational diabetes mellitus in childbirth, insulin controlled: Secondary | ICD-10-CM | POA: Diagnosis present

## 2018-07-27 DIAGNOSIS — J45909 Unspecified asthma, uncomplicated: Secondary | ICD-10-CM | POA: Diagnosis present

## 2018-07-27 DIAGNOSIS — Z3A35 35 weeks gestation of pregnancy: Secondary | ICD-10-CM

## 2018-07-27 DIAGNOSIS — Z6791 Unspecified blood type, Rh negative: Secondary | ICD-10-CM

## 2018-07-27 DIAGNOSIS — Z3A34 34 weeks gestation of pregnancy: Secondary | ICD-10-CM | POA: Diagnosis not present

## 2018-07-27 DIAGNOSIS — O1414 Severe pre-eclampsia complicating childbirth: Secondary | ICD-10-CM | POA: Diagnosis not present

## 2018-07-27 DIAGNOSIS — O99344 Other mental disorders complicating childbirth: Secondary | ICD-10-CM | POA: Diagnosis present

## 2018-07-27 DIAGNOSIS — Z87891 Personal history of nicotine dependence: Secondary | ICD-10-CM

## 2018-07-27 DIAGNOSIS — Z302 Encounter for sterilization: Secondary | ICD-10-CM

## 2018-07-27 DIAGNOSIS — R079 Chest pain, unspecified: Secondary | ICD-10-CM | POA: Diagnosis present

## 2018-07-27 DIAGNOSIS — O26893 Other specified pregnancy related conditions, third trimester: Secondary | ICD-10-CM | POA: Diagnosis present

## 2018-07-27 DIAGNOSIS — E039 Hypothyroidism, unspecified: Secondary | ICD-10-CM | POA: Diagnosis present

## 2018-07-27 DIAGNOSIS — F419 Anxiety disorder, unspecified: Secondary | ICD-10-CM | POA: Diagnosis present

## 2018-07-27 DIAGNOSIS — O99284 Endocrine, nutritional and metabolic diseases complicating childbirth: Secondary | ICD-10-CM | POA: Diagnosis present

## 2018-07-27 DIAGNOSIS — O99214 Obesity complicating childbirth: Secondary | ICD-10-CM | POA: Diagnosis present

## 2018-07-27 DIAGNOSIS — R030 Elevated blood-pressure reading, without diagnosis of hypertension: Secondary | ICD-10-CM | POA: Diagnosis not present

## 2018-07-27 DIAGNOSIS — O34211 Maternal care for low transverse scar from previous cesarean delivery: Secondary | ICD-10-CM | POA: Diagnosis present

## 2018-07-27 DIAGNOSIS — O9952 Diseases of the respiratory system complicating childbirth: Secondary | ICD-10-CM | POA: Diagnosis present

## 2018-07-27 DIAGNOSIS — Z1159 Encounter for screening for other viral diseases: Secondary | ICD-10-CM

## 2018-07-27 DIAGNOSIS — O1490 Unspecified pre-eclampsia, unspecified trimester: Secondary | ICD-10-CM

## 2018-07-27 HISTORY — DX: Unspecified pre-eclampsia, unspecified trimester: O14.90

## 2018-07-27 NOTE — MAU Note (Signed)
CTX every 3-5 minutes for the past 3 hours.  BP at home was 170/107.  Previous pre-e but this pregnancy just gestational HTN.  No LOF/VB.  + FM.

## 2018-07-28 ENCOUNTER — Encounter (HOSPITAL_COMMUNITY): Payer: Self-pay

## 2018-07-28 DIAGNOSIS — O1490 Unspecified pre-eclampsia, unspecified trimester: Secondary | ICD-10-CM | POA: Diagnosis present

## 2018-07-28 DIAGNOSIS — Z3A34 34 weeks gestation of pregnancy: Secondary | ICD-10-CM

## 2018-07-28 DIAGNOSIS — O1493 Unspecified pre-eclampsia, third trimester: Secondary | ICD-10-CM

## 2018-07-28 LAB — COMPREHENSIVE METABOLIC PANEL
ALT: 16 U/L (ref 0–44)
AST: 17 U/L (ref 15–41)
Albumin: 2.5 g/dL — ABNORMAL LOW (ref 3.5–5.0)
Alkaline Phosphatase: 68 U/L (ref 38–126)
Anion gap: 11 (ref 5–15)
BUN: 12 mg/dL (ref 6–20)
CO2: 22 mmol/L (ref 22–32)
Calcium: 9 mg/dL (ref 8.9–10.3)
Chloride: 104 mmol/L (ref 98–111)
Creatinine, Ser: 0.96 mg/dL (ref 0.44–1.00)
GFR calc Af Amer: >60 mL/min (ref >60)
GFR calc non Af Amer: >60 mL/min (ref >60)
Glucose, Bld: 81 mg/dL (ref 70–99)
Potassium: 3.6 mmol/L (ref 3.5–5.1)
Sodium: 137 mmol/L (ref 135–145)
Total Bilirubin: 0.4 mg/dL (ref 0.3–1.2)
Total Protein: 5.3 g/dL — ABNORMAL LOW (ref 6.5–8.1)

## 2018-07-28 LAB — CBC
HCT: 34.6 % — ABNORMAL LOW (ref 36.0–46.0)
Hemoglobin: 11.7 g/dL — ABNORMAL LOW (ref 12.0–15.0)
MCH: 28.3 pg (ref 26.0–34.0)
MCHC: 33.8 g/dL (ref 30.0–36.0)
MCV: 83.8 fL (ref 80.0–100.0)
Platelets: 180 10*3/uL (ref 150–400)
RBC: 4.13 MIL/uL (ref 3.87–5.11)
RDW: 14.3 % (ref 11.5–15.5)
WBC: 13.3 10*3/uL — ABNORMAL HIGH (ref 4.0–10.5)
nRBC: 0 % (ref 0.0–0.2)

## 2018-07-28 LAB — PROTEIN / CREATININE RATIO, URINE
Creatinine, Urine: 178.6 mg/dL
Protein Creatinine Ratio: 0.67 mg/mg{Cre} — ABNORMAL HIGH (ref 0.00–0.15)
Total Protein, Urine: 119 mg/dL

## 2018-07-28 LAB — GLUCOSE, CAPILLARY
Glucose-Capillary: 241 mg/dL — ABNORMAL HIGH (ref 70–99)
Glucose-Capillary: 178 mg/dL — ABNORMAL HIGH (ref 70–99)
Glucose-Capillary: 194 mg/dL — ABNORMAL HIGH (ref 70–99)
Glucose-Capillary: 148 mg/dL — ABNORMAL HIGH (ref 70–99)

## 2018-07-28 LAB — TYPE AND SCREEN
ABO/RH(D): A NEG
Antibody Screen: NEGATIVE

## 2018-07-28 LAB — ABO/RH: ABO/RH(D): A NEG

## 2018-07-28 LAB — SARS CORONAVIRUS 2 BY RT PCR (HOSPITAL ORDER, PERFORMED IN ~~LOC~~ HOSPITAL LAB): SARS Coronavirus 2: NEGATIVE

## 2018-07-28 MED ORDER — BETAMETHASONE SOD PHOS & ACET 6 (3-3) MG/ML IJ SUSP
12.0000 mg | INTRAMUSCULAR | Status: AC
  Administered 2018-07-28 – 2018-07-29 (×2): 12 mg via INTRAMUSCULAR
  Filled 2018-07-28 (×2): qty 2

## 2018-07-28 MED ORDER — LABETALOL HCL 5 MG/ML IV SOLN: 80.0000 mg | INTRAVENOUS | Status: DC | PRN

## 2018-07-28 MED ORDER — LABETALOL HCL 5 MG/ML IV SOLN
20.0000 mg | INTRAVENOUS | Status: DC | PRN
  Filled 2018-07-28: qty 4

## 2018-07-28 MED ORDER — MONTELUKAST SODIUM 10 MG PO TABS
10.0000 mg | ORAL_TABLET | Freq: Every day | ORAL | Status: DC
  Administered 2018-07-28 – 2018-07-29 (×2): 10 mg via ORAL
  Filled 2018-07-28 (×3): qty 1

## 2018-07-28 MED ORDER — LEVOTHYROXINE SODIUM 75 MCG PO TABS
75.0000 ug | ORAL_TABLET | Freq: Every day | ORAL | Status: DC
  Administered 2018-07-28 – 2018-07-30 (×3): 75 ug via ORAL
  Filled 2018-07-28 (×2): qty 1
  Filled 2018-07-28: qty 3
  Filled 2018-07-28: qty 1

## 2018-07-28 MED ORDER — LABETALOL HCL 100 MG PO TABS
100.0000 mg | ORAL_TABLET | Freq: Two times a day (BID) | ORAL | Status: DC
  Administered 2018-07-28 – 2018-07-29 (×3): 100 mg via ORAL
  Filled 2018-07-28 (×3): qty 1

## 2018-07-28 MED ORDER — LACTATED RINGERS IV SOLN
INTRAVENOUS | Status: DC
  Administered 2018-07-28: 04:00:00 via INTRAVENOUS

## 2018-07-28 MED ORDER — CALCIUM CARBONATE ANTACID 500 MG PO CHEW: 2.0000 | CHEWABLE_TABLET | ORAL | Status: DC | PRN

## 2018-07-28 MED ORDER — ACETAMINOPHEN 325 MG PO TABS
650.0000 mg | ORAL_TABLET | ORAL | Status: DC | PRN
  Administered 2018-07-28 – 2018-07-29 (×4): 650 mg via ORAL
  Filled 2018-07-28 (×3): qty 2

## 2018-07-28 MED ORDER — DOCUSATE SODIUM 100 MG PO CAPS: 100.0000 mg | ORAL_CAPSULE | Freq: Every day | ORAL | Status: DC

## 2018-07-28 MED ORDER — ACETAMINOPHEN 325 MG PO TABS
650.0000 mg | ORAL_TABLET | Freq: Four times a day (QID) | ORAL | Status: DC | PRN
  Filled 2018-07-28: qty 2

## 2018-07-28 MED ORDER — SERTRALINE HCL 50 MG PO TABS
50.0000 mg | ORAL_TABLET | Freq: Every day | ORAL | Status: DC
  Administered 2018-07-28: 11:00:00 50 mg via ORAL
  Administered 2018-07-29: 09:00:00 25 mg via ORAL
  Administered 2018-07-30: 11:00:00 50 mg via ORAL
  Filled 2018-07-28 (×3): qty 1

## 2018-07-28 MED ORDER — LABETALOL HCL 5 MG/ML IV SOLN: 40.0000 mg | INTRAVENOUS | Status: DC | PRN

## 2018-07-28 MED ORDER — ZOLPIDEM TARTRATE 5 MG PO TABS: 5.0000 mg | ORAL_TABLET | Freq: Every evening | ORAL | Status: DC | PRN

## 2018-07-28 MED ORDER — INSULIN ASPART 100 UNIT/ML ~~LOC~~ SOLN
2.0000 [IU] | Freq: Once | SUBCUTANEOUS | Status: AC
  Administered 2018-07-28: 23:00:00 2 [IU] via SUBCUTANEOUS

## 2018-07-28 MED ORDER — HYDRALAZINE HCL 20 MG/ML IJ SOLN: 10.0000 mg | INTRAMUSCULAR | Status: DC | PRN

## 2018-07-28 MED ORDER — INSULIN GLARGINE 100 UNIT/ML ~~LOC~~ SOLN
100.0000 [IU] | Freq: Every day | SUBCUTANEOUS | Status: DC
  Administered 2018-07-28 – 2018-07-30 (×3): 100 [IU] via SUBCUTANEOUS
  Filled 2018-07-28 (×5): qty 1

## 2018-07-28 MED ORDER — INSULIN ASPART 100 UNIT/ML ~~LOC~~ SOLN
0.0000 [IU] | Freq: Three times a day (TID) | SUBCUTANEOUS | Status: DC
  Administered 2018-07-28: 20:00:00 2 [IU] via SUBCUTANEOUS

## 2018-07-28 MED ORDER — INSULIN ASPART 100 UNIT/ML ~~LOC~~ SOLN
0.0000 [IU] | Freq: Three times a day (TID) | SUBCUTANEOUS | Status: DC
  Administered 2018-07-29 (×2): 4 [IU] via SUBCUTANEOUS
  Administered 2018-07-29: 10:00:00 7 [IU] via SUBCUTANEOUS
  Administered 2018-07-30: 11:00:00 3 [IU] via SUBCUTANEOUS

## 2018-07-28 MED ORDER — INSULIN ASPART 100 UNIT/ML ~~LOC~~ SOLN
0.0000 [IU] | Freq: Once | SUBCUTANEOUS | Status: AC
  Administered 2018-07-28: 17:00:00 2 [IU] via SUBCUTANEOUS

## 2018-07-28 MED ORDER — PRENATAL MULTIVITAMIN CH
1.0000 | ORAL_TABLET | Freq: Every day | ORAL | Status: DC
  Administered 2018-07-28 – 2018-07-30 (×3): 1 via ORAL
  Filled 2018-07-28 (×3): qty 1

## 2018-07-28 NOTE — MAU Provider Note (Signed)
Chief Complaint:  Contractions and Hypertension  Provider saw patient at 2120    HPI: Melissa Mckay is a 38 y.o. [id number] at 70w3dwho presents to maternity admissions reporting mild contractions for a few hours.  BP was elevated at home.  Had been diagnosed recently with preeclampsia but is not on meds yet.  Scheduled for a C/S at 37 weeks   Also has a headache.. She reports good fetal movement, denies LOF, vaginal bleeding, vaginal itching/burning, urinary symptoms, dizziness, n/v, diarrhea, constipation or fever/chills.  She denies visual changes or RUQ abdominal pain.  RN Note: CTX every 3-5 minutes for the past 3 hours.  BP at home was 170/107.  Previous pre-e but this pregnancy just gestational HTN.  No LOF/VB.  + FM.  Past Medical History: Past Medical History:  Diagnosis Date  . Asthma   . Chlamydia   . CMV (cytomegalovirus) antibody positive 2020  . Genital HSV   . Gestational diabetes    GDM  . Hypothyroidism   . Normal pregnancy 09/10/2010  . Preeclampsia   . Pregnancy induced hypertension 2012   preE G1    Past obstetric history: OB History  Gravida Para Term Preterm AB Living  0 2  SAB TAB Ectopic Multiple Live Births  0 0 0 0 2    # Outcome Date GA Lbr Len/2nd Weight Sex Delivery Anes PTL Lv  3 Current           2 Preterm 07/16/14 [redacted]w[redacted]d  1330 g F CS-LTranv Spinal  LIV  1 Term 09/13/10 [redacted]w[redacted]d 11:05 / 00:28 3090 g M Vag-Spont EPI  LIV    Obstetric Comments  C-section for CMV and decrease FM per pt.     Past Surgical History: Past Surgical History:  Procedure Laterality Date  . 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 BREAST SURGERY  age 75   reduction  . CESAREAN SECTION N/A 07/16/2014   Procedure: CESAREAN SECTION;  Surgeon: Lavina Hamman, MD;  Location: [facility];  Service: Obstetrics;  Laterality: N/A;  . CHOLECYSTECTOMY  2017    Family History: Family History  Problem Relation Age of Onset  . Cancer Mother   .  Diabetes Mother   . Anemia Other   . COPD Other   . Depression Other   . Diabetes type II Other   . Fibromyalgia Other   . Hypertension Other   . Thyroid disease Other   . Diabetes Father     Social History: Social History   Tobacco Use  . Smoking status: Former Games developer  . Smokeless tobacco: Never Used  Substance Use Topics  . Alcohol use: No  . Drug use: No    Allergies:  Allergies  Allergen Reactions  . Sulfa Antibiotics Hives and Shortness Of Breath    Meds:  Medications Prior to Admission  Medication Sig Dispense Refill Last Dose  . acetaminophen (TYLENOL) 325 MG tablet Take 650 mg by mouth every 6 (six) hours as needed.   More than a month at Unknown time  . albuterol (PROVENTIL HFA;VENTOLIN HFA) 108 (90 BASE) MCG/ACT inhaler Inhale 2 puffs into the lungs every 6 (six) hours as needed for wheezing or shortness of breath. Reported on 09/20/2015   06/30/2018 at Unknown time  . Fluticasone Propionate (FLONASE NA) Place into the nose.   06/29/2018 at Unknown time  . levothyroxine (SYNTHROID, LEVOTHROID) 75 MCG tablet Take 1 tablet (75 mcg total) by mouth  daily. 90 tablet 3 06/30/2018 at Unknown time  . metFORMIN (GLUCOPHAGE) 500 MG tablet Take by mouth 2 (two) times daily with a meal.   06/30/2018 at Unknown time  . mometasone-formoterol (DULERA) 200-5 MCG/ACT AERO Dulera 200 mcg-5 mcg/actuation HFA aerosol inhaler   06/29/2018 at Unknown time  . montelukast (SINGULAIR) 10 MG tablet Take 10 mg by mouth daily as needed. As needed for allergies   06/30/2018 at Unknown time  . Prenatal Vit-Fe Fumarate-FA (PRENATAL VITAMIN PO) Take by mouth.   Past Week at Unknown time  . sertraline (ZOLOFT) 50 MG tablet Take 50 mg by mouth daily.   06/29/2018 at Unknown time    I have reviewed patient's Past Medical Hx, Surgical Hx, Family Hx, Social Hx, medications and allergies.   ROS:  Review of Systems  Constitutional: Negative for chills and fever.  Eyes: Negative for visual disturbance.   Respiratory: Negative for shortness of breath.   Cardiovascular: Positive for leg swelling.  Gastrointestinal: Negative for abdominal pain, constipation, diarrhea and nausea.  Genitourinary: Negative for dysuria, vaginal bleeding and vaginal discharge.  Musculoskeletal: Negative for back pain.  Neurological: Positive for headaches. Negative for dizziness, speech difficulty, weakness, light-headedness and numbness.   Other systems negative  Physical Exam   Patient Vitals for the past 24 hrs:  BP Temp Pulse Resp Weight  07/28/18 0058 (!) 149/71 - 61 - -  07/28/18 0034 139/63 - 60 - -  07/28/18 0016 (!) 153/81 - 61 - -  07/28/18 0001 (!) 149/74 - 68 - -  07/27/18 2348 (!) 151/73 - 72 - -  07/27/18 2328 - 98.1 F (36.7 C) - 19 -  07/27/18 2325 (!) 167/88 - 71 - 135.8 kg   Most recent BPs had systolic values of 161-163  Constitutional: Well-developed, well-nourished female in no acute distress.  Cardiovascular: normal rate and rhythm Respiratory: normal effort, clear to auscultation bilaterally GI: Abd soft, non-tender, gravid appropriate for gestational age.   No rebound or guarding. MS: Extremities nontender, Trace to 1+pedal edema, normal ROM Neurologic: Alert and oriented x 4, DTRs 3+ with no clonus.  GU: Neg CVAT.  PELVIC EXAM:   Cervix closed/long/Ballotable/vertex   FHT:  Baseline 140 , moderate variability, accelerations present, no decelerations Contractions:  Irregular     Labs: Results for orders placed or performed during the hospital encounter of 07/27/18 (from the past 24 hour(s))  CBC     Status: Abnormal   Collection Time: 07/28/18 12:29 AM  Result Value Ref Range   WBC 13.3 (H) 4.0 - 10.5 K/uL   RBC 4.13 3.87 - 5.11 MIL/uL   Hemoglobin 11.7 (L) 12.0 - 15.0 g/dL   HCT 16.134.6 (L) 09.636.0 - 04.546.0 %   MCV 83.8 80.0 - 100.0 fL   MCH 28.3 26.0 - 34.0 pg   MCHC 33.8 30.0 - 36.0 g/dL   RDW 40.914.3 81.111.5 - 91.415.5 %   Platelets 180 150 - 400 K/uL   nRBC 0.0 0.0 - 0.2 %   Comprehensive metabolic panel     Status: Abnormal   Collection Time: 07/28/18 12:29 AM  Result Value Ref Range   Sodium 137 135 - 145 mmol/L   Potassium 3.6 3.5 - 5.1 mmol/L   Chloride 104 98 - 111 mmol/L   CO2 22 22 - 32 mmol/L   Glucose, Bld 81 70 - 99 mg/dL   BUN 12 6 - 20 mg/dL   Creatinine, Ser 7.820.96 0.44 - 1.00 mg/dL   Calcium 9.0 8.9 -  10.3 mg/dL   Total Protein 5.3 (L) 6.5 - 8.1 g/dL   Albumin 2.5 (L) 3.5 - 5.0 g/dL   AST 17 15 - 41 U/L   ALT 16 0 - 44 U/L   Alkaline Phosphatase 68 38 - 126 U/L   Total Bilirubin 0.4 0.3 - 1.2 mg/dL   GFR calc non Af Amer >60 >60 mL/min   GFR calc Af Amer >60 >60 mL/min   Anion gap 11 5 - 15  Type and screen [facility]     Status: None (Preliminary result)   Collection Time: 07/28/18  2:47 AM  Result Value Ref Range   ABO/RH(D) PENDING    Antibody Screen PENDING    Sample Expiration      07/31/2018,2359 Performed at [facility], [address], [place], [place] [address]    Protein/Creatinine Ratio Pending  Imaging:  No results found.  MAU Course/MDM: I have ordered labs and reviewed results. Labs so far are normal with Pr/Cr ratio pending Creatinine noted to be elevated for pregnancy NST reviewed, reactive with irregular mild contractions.  Cervix is long and closed Consult Dr Shawnie Pons and Dr Mindi Slicker with presentation, exam findings and test results.  Tylenol given for H/A Preeclampsia focused order set ordered  Assessment: 1. Pre-eclampsia in third trimester   2.      Single intrauterine pregnancy at [redacted]w[redacted]d 3.      Headache 4.      Gestational diabetes, insulin requiring 5.      Hypothyroidism  Plan: Admit for observation Watch BPs, IV Labetalol PRN MD to follow   Wynelle Bourgeois CNM, MSN Certified Nurse-Midwife 07/28/2018 2:37 AM

## 2018-07-28 NOTE — H&P (Signed)
Melissa Mckay is a 70 [id number] female presenting at 32 3/7wks  for irregular cramping and elevated BP. Pt with history of preE in prior pregnancy and also preE in this pregnancy - has not needed medication. She reports occasional HA. She is dated per 6 week [place]. Pregnancy has been complicated by AMA< hypothyroidism ( on synthroid) ; GDM - jsut started on insulin injections yesterday, anxiety ( on zoloft 50mg ) and asthma - has had to use inhaler in pregnancy. GBS is unknown.  In MAU pts BP noted to be elevated ut labs wnl. ( had 0.4 protein in office ). She is appreciating FMs OB History    Gravida  3   Para  2   Term  1   Preterm  1   AB  0   Living  2     SAB  0   TAB  0   Ectopic  0   Multiple  0   Live Births  2        Obstetric Comments  C-section for CMV and decrease FM per pt.        Past Medical History:  Diagnosis Date  . Asthma   . Chlamydia   . CMV (cytomegalovirus) antibody positive 2020  . Genital HSV   . Gestational diabetes    GDM  . Hypothyroidism   . Normal pregnancy 09/10/2010  . Preeclampsia   . Pregnancy induced hypertension 2012   preE G1   Past Surgical History:  Procedure Laterality Date  . 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 BREAST SURGERY  age 67   reduction  . CESAREAN SECTION N/A 07/16/2014   Procedure: CESAREAN SECTION;  Surgeon: Melissa Hamman, MD;  Location: [facility];  Service: Obstetrics;  Laterality: N/A;  . CHOLECYSTECTOMY  2017   Family History: family history includes Anemia in an other family member; COPD in an other family member; Cancer in her mother; Depression in an other family member; Diabetes in her father and mother; Diabetes type II in an other family member; Fibromyalgia in an other family member; Hypertension in an other family member; Thyroid disease in an other family member. Social History:  reports that she has quit smoking. She has never used smokeless tobacco. She  reports that she does not drink alcohol or use drugs.     Maternal Diabetes: Yes:  Diabetes Type:  Insulin/Medication controlled Genetic Screening: Normal Maternal Ultrasounds/Referrals: Normal Fetal Ultrasounds or other Referrals:  None Maternal Substance Abuse:  No Significant Maternal Medications:  Meds include: Other:  see HPI Significant Maternal Lab Results:  Lab values include: Other: GBS unknown Other Comments:  None  Review of Systems  Constitutional: Negative for chills, fever, malaise/fatigue and weight loss.  Eyes: Negative for blurred vision and double vision.  Respiratory: Positive for shortness of breath.   Cardiovascular: Negative for chest pain.  Gastrointestinal: Positive for abdominal pain. Negative for heartburn, nausea and vomiting.  Genitourinary: Negative for dysuria.  Musculoskeletal: Negative for myalgias.  Skin: Negative for itching and rash.  Neurological: Positive for headaches. Negative for dizziness.  Psychiatric/Behavioral: Negative for depression, hallucinations, substance abuse and suicidal ideas. The patient is nervous/anxious.    Maternal Medical History:  Reason for admission: Nausea. PreE, cramping  Contractions: Onset was yesterday.   Frequency: irregular.   Perceived severity is mild.    Fetal activity: Perceived fetal activity is normal.   Last perceived fetal movement was within the past hour.  Prenatal complications: Pre-eclampsia.   Prenatal Complications - Diabetes: gestational. Diabetes is managed by insulin injections.        Blood pressure 131/77, pulse 83, temperature 98.2 F (36.8 C), temperature source Oral, resp. rate 18, height 5\' 4"  (1.626 m), weight 135.8 kg, last menstrual period 11/06/2017, SpO2 99 %, unknown if currently breastfeeding. Maternal Exam:  Uterine Assessment: Contraction strength is mild.  Contraction frequency is rare.   Abdomen: Patient reports generalized tenderness.  Surgical scars: low  transverse.   Estimated fetal weight is AGA.       Fetal Exam Fetal Monitor Review: Baseline rate: 130.  Variability: moderate (6-25 bpm).   Pattern: accelerations present and no decelerations.    Fetal State Assessment: Category I - tracings are normal.     Physical Exam  Constitutional: She is oriented to person, place, and time. She appears well-developed and well-nourished.  Neck: Normal range of motion.  Respiratory: Effort normal.  GI: Soft. There is generalized abdominal tenderness.  Musculoskeletal: Normal range of motion.  Neurological: She is alert and oriented to person, place, and time.  Skin: Skin is warm.  Psychiatric: She has a normal mood and affect. Her behavior is normal. Judgment and thought content normal.    Prenatal labs: ABO, Rh: --/--/A NEG, A NEG Performed at [facility], [address] [place], [place] [address]  [phone](05/28 [id number]) Antibody: NEG (05/28 0247) Rubella:   RPR:    HBsAg:    HIV:    GBS:     Assessment/Plan: 37yo G3P1011 at 7834 3/[redacted]wks gestation with preE , GDM, hypothyroidism - admitted for monitoring and treatment of BP Labetalol protocol started- good response; consider oral labetalol till 37wks - start 100mg  po bid  BMZ given - first dose at 0316am 5/28 Continuous NST - consider changing to q shift Consider MFM consult     Melissa Mckay 07/28/2018, 5:46 AM

## 2018-07-28 NOTE — Progress Notes (Signed)
Patient ID: Melissa Mckay, female   DOB: Jul 25, 1980, 38 y.o.   MRN: [id number]  Added SSI for coverage after meals.   Elevated FSBS - before Lantus, after BMZ PP 178 - started sensitive SSI  FHTs 120's, mod var, + accels, category 1 BP much improved today (labetalol 100mg  bid) 125-146/63-77 (125/63)  Second BMZ at 3am  Continue current mgmt   Pt with PreE, GDM - admitted w elevated BP - started meds Just switched from metformin to Lantus.

## 2018-07-28 NOTE — Progress Notes (Signed)
Patient ID: Melissa Mckay, female   DOB: February 06, 1981, 38 y.o.   MRN: [id number]   34+3 PreEclampsia, elevated BP - on po labetalol and labetalol protocol, GDM - started Lantus yesterday, Got 1/2 BMZ 2nd at 3am 5/29   AF 129-166/63-81 (129/62)  gen  NAD Abd soft, FNT  FHTs 120-130's, mod var, category 1, + accels toco none, pt claims to have had many overnight  Continue current mgmt.

## 2018-07-29 ENCOUNTER — Observation Stay (HOSPITAL_COMMUNITY): Payer: BLUE CROSS/BLUE SHIELD

## 2018-07-29 DIAGNOSIS — O99214 Obesity complicating childbirth: Secondary | ICD-10-CM | POA: Diagnosis present

## 2018-07-29 DIAGNOSIS — O1414 Severe pre-eclampsia complicating childbirth: Secondary | ICD-10-CM | POA: Diagnosis present

## 2018-07-29 DIAGNOSIS — O26893 Other specified pregnancy related conditions, third trimester: Secondary | ICD-10-CM | POA: Diagnosis present

## 2018-07-29 DIAGNOSIS — O98513 Other viral diseases complicating pregnancy, third trimester: Secondary | ICD-10-CM | POA: Diagnosis not present

## 2018-07-29 DIAGNOSIS — O24424 Gestational diabetes mellitus in childbirth, insulin controlled: Secondary | ICD-10-CM | POA: Diagnosis present

## 2018-07-29 DIAGNOSIS — B009 Herpesviral infection, unspecified: Secondary | ICD-10-CM

## 2018-07-29 DIAGNOSIS — O360130 Maternal care for anti-D [Rh] antibodies, third trimester, not applicable or unspecified: Secondary | ICD-10-CM

## 2018-07-29 DIAGNOSIS — Z1159 Encounter for screening for other viral diseases: Secondary | ICD-10-CM | POA: Diagnosis not present

## 2018-07-29 DIAGNOSIS — O09293 Supervision of pregnancy with other poor reproductive or obstetric history, third trimester: Secondary | ICD-10-CM

## 2018-07-29 DIAGNOSIS — J45909 Unspecified asthma, uncomplicated: Secondary | ICD-10-CM | POA: Diagnosis present

## 2018-07-29 DIAGNOSIS — O99283 Endocrine, nutritional and metabolic diseases complicating pregnancy, third trimester: Secondary | ICD-10-CM | POA: Diagnosis not present

## 2018-07-29 DIAGNOSIS — O34211 Maternal care for low transverse scar from previous cesarean delivery: Secondary | ICD-10-CM | POA: Diagnosis present

## 2018-07-29 DIAGNOSIS — Z3A35 35 weeks gestation of pregnancy: Secondary | ICD-10-CM | POA: Diagnosis not present

## 2018-07-29 DIAGNOSIS — O99213 Obesity complicating pregnancy, third trimester: Secondary | ICD-10-CM

## 2018-07-29 DIAGNOSIS — Z3A34 34 weeks gestation of pregnancy: Secondary | ICD-10-CM

## 2018-07-29 DIAGNOSIS — O99284 Endocrine, nutritional and metabolic diseases complicating childbirth: Secondary | ICD-10-CM | POA: Diagnosis present

## 2018-07-29 DIAGNOSIS — Z6791 Unspecified blood type, Rh negative: Secondary | ICD-10-CM | POA: Diagnosis not present

## 2018-07-29 DIAGNOSIS — O9952 Diseases of the respiratory system complicating childbirth: Secondary | ICD-10-CM | POA: Diagnosis present

## 2018-07-29 DIAGNOSIS — E039 Hypothyroidism, unspecified: Secondary | ICD-10-CM | POA: Diagnosis present

## 2018-07-29 DIAGNOSIS — O34219 Maternal care for unspecified type scar from previous cesarean delivery: Secondary | ICD-10-CM

## 2018-07-29 DIAGNOSIS — F419 Anxiety disorder, unspecified: Secondary | ICD-10-CM | POA: Diagnosis present

## 2018-07-29 DIAGNOSIS — Z302 Encounter for sterilization: Secondary | ICD-10-CM | POA: Diagnosis not present

## 2018-07-29 DIAGNOSIS — Z87891 Personal history of nicotine dependence: Secondary | ICD-10-CM | POA: Diagnosis not present

## 2018-07-29 DIAGNOSIS — O24414 Gestational diabetes mellitus in pregnancy, insulin controlled: Secondary | ICD-10-CM

## 2018-07-29 DIAGNOSIS — O10013 Pre-existing essential hypertension complicating pregnancy, third trimester: Secondary | ICD-10-CM

## 2018-07-29 DIAGNOSIS — R079 Chest pain, unspecified: Secondary | ICD-10-CM | POA: Diagnosis present

## 2018-07-29 DIAGNOSIS — O99344 Other mental disorders complicating childbirth: Secondary | ICD-10-CM | POA: Diagnosis present

## 2018-07-29 DIAGNOSIS — R030 Elevated blood-pressure reading, without diagnosis of hypertension: Secondary | ICD-10-CM | POA: Diagnosis present

## 2018-07-29 LAB — GLUCOSE, CAPILLARY
Glucose-Capillary: 124 mg/dL — ABNORMAL HIGH (ref 70–99)
Glucose-Capillary: 152 mg/dL — ABNORMAL HIGH (ref 70–99)
Glucose-Capillary: 222 mg/dL — ABNORMAL HIGH (ref 70–99)
Glucose-Capillary: 152 mg/dL — ABNORMAL HIGH (ref 70–99)
Glucose-Capillary: 159 mg/dL — ABNORMAL HIGH (ref 70–99)

## 2018-07-29 MED ORDER — PANTOPRAZOLE SODIUM 40 MG PO TBEC
40.0000 mg | DELAYED_RELEASE_TABLET | Freq: Every day | ORAL | Status: DC
  Administered 2018-07-29: 09:00:00 40 mg via ORAL
  Filled 2018-07-29 (×3): qty 1

## 2018-07-29 MED ORDER — LABETALOL HCL 200 MG PO TABS
200.0000 mg | ORAL_TABLET | Freq: Two times a day (BID) | ORAL | Status: DC
  Administered 2018-07-29 – 2018-07-30 (×2): 200 mg via ORAL
  Filled 2018-07-29 (×2): qty 1

## 2018-07-29 NOTE — Progress Notes (Signed)
Patient ID: Melissa Mckay, female   DOB: 04-Mar-1980, 38 y.o.   MRN: [id number] HD#2 Mild Preeclampsia/GDM/Asthma  Pt reports no HA or vision changes.  She did have the episode of chest pain this AM with normal pulse ox and EKG just showing mild bradycardia c/w with her labetalol (per prelim reading)  Good FM  BP well-controlled on labetalol 100mg  po BID since admission whe had some 160/90 reange Labs WNL yesterday except Prot:creat elevated at 0.67 c/w preeclampsia  FHR category 1 on intermittent monitoring  Received second betamethasone this AM and BS still elevated from that: 124 fasting 222 post breakfast--covered by sliding scale novalog  D/w pt need to cover BS temporarily from impact of betamethasone and then adjust lantus Since she just started lantus 2 days ago hard to know if needs more although she reports the day before admission BS was WNL on first day she was on the lantus.  D/w her meal choices  Will get [place] to confirm normal growth today and plan to d/c tomorrow AM as long as BS improve s/p betamethasone.

## 2018-07-29 NOTE — Plan of Care (Signed)
  Problem: Education: Goal: Knowledge of disease or condition will improve Outcome: Progressing   Problem: Education: Goal: Knowledge of disease or condition will improve Outcome: Progressing   

## 2018-07-29 NOTE — Discharge Summary (Addendum)
Physician Discharge Summary  Patient ID: Melissa LackJennifer Mckay MRN: [id number] DOB/AGE: 36/11/1980 36 y.o.  Admit date: 07/27/2018 Discharge date: 07/30/2018  Admission Diagnoses:  Preeclampsia at 34 3/[redacted] weeks                                          Gestational Diabetes  Discharge Diagnoses:  Active Problems:   Preeclampsia Gestational Diabletes  Discharged Condition: good  Hospital Course: Patient was admitted with elevated blood pressure and found to have elevated prot:creat ratio of 0.67.  Other labs were WNL.  She was administered betamethasone 07/28/18 and 07/29/18 and her blood sugars wre elevated as expected and covered with a sliding scale while she received her usual 100 units of lantus q AM.  Her BP on admission was 160/70-80, so she was started on oral labetalol and it improved on 200mg  po BID to 130-60.  Her labs were rechecked on 07/30/18 and remained WNL.  FHR monitoring was category 1 her entrie admission and an US revealed normal growth at 75%ile, AFI 11 and vtx.  She had one episode of chest pain on the night of admission which has had before at home.  An EKG was normal and pulse ox normal.  She was started on protonix to see if would help and did not complain of it the night prior to d/c.Marland Kitchen.    Significant Diagnostic Studies: Fetal US, EKG  Treatments: Betamethasone, Labetalol, Insulin  Discharge Exam: Blood pressure (!) 149/77, pulse 61, temperature 98.1 F (36.7 C), temperature source Oral, resp. rate 18, height 5\' 4"  (1.626 m), weight 135.8 kg, last menstrual period 11/06/2017, SpO2 96 %, unknown if currently breastfeeding. General appearance: alert and cooperative Cardio: regular rate and rhythm GI: soft gravid NT Extremities: mild edema bilaterally  Disposition:  Discharge disposition: 01-Home or Self Care       Discharge Instructions    Call MD for:  persistant nausea and vomiting   Complete by:  As directed    Call MD for:  severe uncontrolled pain   Complete  by:  As directed    Diet - low sodium heart healthy   Complete by:  As directed      Allergies as of 07/30/2018      Reactions   Sulfa Antibiotics Hives, Shortness Of Breath      Medication List    STOP taking these medications   metFORMIN 500 MG tablet Commonly known as:  GLUCOPHAGE     TAKE these medications   albuterol 108 (90 Base) MCG/ACT inhaler Commonly known as:  VENTOLIN HFA Inhale 2 puffs into the lungs every 6 (six) hours as needed for wheezing or shortness of breath. Reported on 09/20/2015   butalbital-acetaminophen-caffeine 50-325-40 MG tablet Commonly known as:  FIORICET Take 2 tablets by mouth every 6 (six) hours as needed for headache.   Dulera 200-5 MCG/ACT Aero Generic drug:  mometasone-formoterol Dulera 200 mcg-5 mcg/actuation HFA aerosol inhaler   FLONASE NA Place into the nose.   insulin glargine 100 UNIT/ML injection Commonly known as:  LANTUS Inject 1 mL (100 Units total) into the skin daily. Start taking on:  Jul 31, 2018   labetalol 200 MG tablet Commonly known as:  NORMODYNE Take 1 tablet (200 mg total) by mouth 2 (two) times daily.   levothyroxine 75 MCG tablet Commonly known as:  SYNTHROID Take 1 tablet (75 mcg total) by mouth daily.  NIFEdipine 30 MG 24 hr tablet Commonly known as:  ADALAT CC Take 1 tablet (30 mg total) by mouth daily. Start taking on:  Jul 31, 2018   PRENATAL VITAMIN PO Take by mouth.   sertraline 50 MG tablet Commonly known as:  ZOLOFT Take 50 mg by mouth daily.   Singulair 10 MG tablet Generic drug:  montelukast Take 10 mg by mouth daily as needed. As needed for allergies   Tylenol 325 MG tablet Generic drug:  acetaminophen Take 650 mg by mouth every 6 (six) hours as needed.      Follow-up Information    Meisinger, Todd, MD Follow up in 2 day(s).   Specialty:  Obstetrics and Gynecology Why:  Monday August 01, 2018 Appointment with Dr. Jackelyn Knife at 300pm with monitoring to follow Contact  information: [address], [address] [place] [address] [phone]           Signed: Oliver Pila 07/30/2018, 3:45 PM

## 2018-07-29 NOTE — Progress Notes (Signed)
Pt c/o sudden onset sharp chest pain and crushing tightness. BP taken and pulse ox applied.

## 2018-07-30 LAB — COMPREHENSIVE METABOLIC PANEL
ALT: 18 U/L (ref 0–44)
AST: 18 U/L (ref 15–41)
Albumin: 2.4 g/dL — ABNORMAL LOW (ref 3.5–5.0)
Alkaline Phosphatase: 58 U/L (ref 38–126)
Anion gap: 12 (ref 5–15)
BUN: 14 mg/dL (ref 6–20)
CO2: 21 mmol/L — ABNORMAL LOW (ref 22–32)
Calcium: 8.7 mg/dL — ABNORMAL LOW (ref 8.9–10.3)
Chloride: 106 mmol/L (ref 98–111)
Creatinine, Ser: 0.71 mg/dL (ref 0.44–1.00)
GFR calc Af Amer: >60 mL/min (ref >60)
GFR calc non Af Amer: >60 mL/min (ref >60)
Glucose, Bld: 122 mg/dL — ABNORMAL HIGH (ref 70–99)
Potassium: 3.8 mmol/L (ref 3.5–5.1)
Sodium: 139 mmol/L (ref 135–145)
Total Bilirubin: 0.3 mg/dL (ref 0.3–1.2)
Total Protein: 5.4 g/dL — ABNORMAL LOW (ref 6.5–8.1)

## 2018-07-30 LAB — GLUCOSE, CAPILLARY
Glucose-Capillary: 111 mg/dL — ABNORMAL HIGH (ref 70–99)
Glucose-Capillary: 142 mg/dL — ABNORMAL HIGH (ref 70–99)

## 2018-07-30 LAB — CBC
HCT: 32.9 % — ABNORMAL LOW (ref 36.0–46.0)
Hemoglobin: 10.7 g/dL — ABNORMAL LOW (ref 12.0–15.0)
MCH: 28.5 pg (ref 26.0–34.0)
MCHC: 32.5 g/dL (ref 30.0–36.0)
MCV: 87.5 fL (ref 80.0–100.0)
Platelets: 166 10*3/uL (ref 150–400)
RBC: 3.76 MIL/uL — ABNORMAL LOW (ref 3.87–5.11)
RDW: 14.7 % (ref 11.5–15.5)
WBC: 14.7 10*3/uL — ABNORMAL HIGH (ref 4.0–10.5)
nRBC: 0.1 % (ref 0.0–0.2)

## 2018-07-30 MED ORDER — NIFEDIPINE ER OSMOTIC RELEASE 30 MG PO TB24
30.0000 mg | ORAL_TABLET | Freq: Every day | ORAL | Status: DC
  Administered 2018-07-30 (×2): 30 mg via ORAL
  Filled 2018-07-30: qty 1

## 2018-07-30 MED ORDER — LABETALOL HCL 200 MG PO TABS: 200.0000 mg | ORAL_TABLET | Freq: Two times a day (BID) | ORAL | 3 refills | Status: DC

## 2018-07-30 MED ORDER — BUTALBITAL-APAP-CAFFEINE 50-325-40 MG PO TABS: 2.0000 | ORAL_TABLET | Freq: Four times a day (QID) | ORAL | 0 refills | Status: DC | PRN

## 2018-07-30 MED ORDER — INSULIN GLARGINE 100 UNIT/ML ~~LOC~~ SOLN: 100.0000 [IU] | Freq: Every day | SUBCUTANEOUS | 11 refills | Status: DC

## 2018-07-30 MED ORDER — BUTALBITAL-APAP-CAFFEINE 50-325-40 MG PO TABS
2.0000 | ORAL_TABLET | Freq: Four times a day (QID) | ORAL | Status: DC | PRN
  Administered 2018-07-30: 11:00:00 2 via ORAL
  Filled 2018-07-30: qty 2

## 2018-07-30 MED ORDER — NIFEDIPINE ER 30 MG PO TB24: 30.0000 mg | ORAL_TABLET | Freq: Every day | ORAL | 1 refills | Status: DC

## 2018-07-30 NOTE — Progress Notes (Signed)
Patient ID: Melissa Mckay, female   DOB: 02-26-1981, 38 y.o.   MRN: [id number] HD #3 Preeclampsia/GDM/Asthma 34 5/7 weeks  Pt reports a dull HA this AM, not severe and has had it coming and going for several days.  SHe also noted some nausea and diarrhea this AM  FBS 111 One hour post breakfast 141  BP highest 150-160/70-80 Now 149/72  Abdomen gravid NT Ext 2+ edema  D/w pt labs this AM WNL and BS coming back into normal range FHR has not been monitored yet today--Category 1 last PM S/p betamethasone x 2 (5/28 and 5/29) BP was elevated intermittently so added procardia XL 30mg  to labetalol 200mg  BID, will watch throughout today and try fioricet for her HA D/w pt if HA improves and BP stable may can d/c home for close outpatient f/u but will see how feels this PM

## 2018-07-30 NOTE — Progress Notes (Signed)
Patient ID: Melissa Mckay, female   DOB: [birth year], 38 y.o.   MRN: [id number] BP improved to 140-150/70's with additon of procardia and HA resolved with fioricet.  She would like to go home and have close f/u in office in 2 days Preeclamptic precautions reviewed

## 2018-07-30 NOTE — Progress Notes (Signed)
Teaching complete d/c to husband  And home

## 2018-08-01 ENCOUNTER — Inpatient Hospital Stay (HOSPITAL_COMMUNITY): Payer: BLUE CROSS/BLUE SHIELD | Admitting: Certified Registered Nurse Anesthetist

## 2018-08-01 ENCOUNTER — Other Ambulatory Visit: Payer: Self-pay

## 2018-08-01 ENCOUNTER — Encounter (HOSPITAL_COMMUNITY)
Admission: AD | Disposition: A | Discharge status: Home / Self Care | Payer: Self-pay | Source: Home / Self Care | Attending: Obstetrics and Gynecology

## 2018-08-01 ENCOUNTER — Inpatient Hospital Stay (HOSPITAL_COMMUNITY)
Admission: AD | Admit: 2018-08-01 | Discharge: 2018-08-04 | Disposition: A | Discharge status: Home / Self Care | Payer: BLUE CROSS/BLUE SHIELD | Source: Home / Self Care | Attending: Obstetrics and Gynecology | Admitting: Obstetrics and Gynecology

## 2018-08-01 DIAGNOSIS — J45909 Unspecified asthma, uncomplicated: Secondary | ICD-10-CM | POA: Diagnosis present

## 2018-08-01 DIAGNOSIS — Z3A35 35 weeks gestation of pregnancy: Secondary | ICD-10-CM

## 2018-08-01 DIAGNOSIS — O34211 Maternal care for low transverse scar from previous cesarean delivery: Secondary | ICD-10-CM | POA: Diagnosis present

## 2018-08-01 DIAGNOSIS — O99284 Endocrine, nutritional and metabolic diseases complicating childbirth: Secondary | ICD-10-CM | POA: Diagnosis present

## 2018-08-01 DIAGNOSIS — Z87891 Personal history of nicotine dependence: Secondary | ICD-10-CM

## 2018-08-01 DIAGNOSIS — O9952 Diseases of the respiratory system complicating childbirth: Secondary | ICD-10-CM | POA: Diagnosis present

## 2018-08-01 DIAGNOSIS — E039 Hypothyroidism, unspecified: Secondary | ICD-10-CM | POA: Diagnosis present

## 2018-08-01 DIAGNOSIS — O1414 Severe pre-eclampsia complicating childbirth: Secondary | ICD-10-CM | POA: Diagnosis present

## 2018-08-01 DIAGNOSIS — Z1159 Encounter for screening for other viral diseases: Secondary | ICD-10-CM

## 2018-08-01 DIAGNOSIS — O99214 Obesity complicating childbirth: Secondary | ICD-10-CM | POA: Diagnosis present

## 2018-08-01 DIAGNOSIS — O1493 Unspecified pre-eclampsia, third trimester: Secondary | ICD-10-CM | POA: Diagnosis present

## 2018-08-01 DIAGNOSIS — O24424 Gestational diabetes mellitus in childbirth, insulin controlled: Secondary | ICD-10-CM | POA: Diagnosis present

## 2018-08-01 DIAGNOSIS — Z302 Encounter for sterilization: Secondary | ICD-10-CM

## 2018-08-01 DIAGNOSIS — Z6791 Unspecified blood type, Rh negative: Secondary | ICD-10-CM

## 2018-08-01 DIAGNOSIS — O26893 Other specified pregnancy related conditions, third trimester: Secondary | ICD-10-CM | POA: Diagnosis present

## 2018-08-01 LAB — COMPREHENSIVE METABOLIC PANEL
ALT: 21 U/L (ref 0–44)
AST: 22 U/L (ref 15–41)
Albumin: 2.8 g/dL — ABNORMAL LOW (ref 3.5–5.0)
Alkaline Phosphatase: 82 U/L (ref 38–126)
Anion gap: 12 (ref 5–15)
BUN: 13 mg/dL (ref 6–20)
CO2: 24 mmol/L (ref 22–32)
Calcium: 9.1 mg/dL (ref 8.9–10.3)
Chloride: 102 mmol/L (ref 98–111)
Creatinine, Ser: 0.72 mg/dL (ref 0.44–1.00)
GFR calc Af Amer: >60 mL/min (ref >60)
GFR calc non Af Amer: >60 mL/min (ref >60)
Glucose, Bld: 94 mg/dL (ref 70–99)
Potassium: 4.1 mmol/L (ref 3.5–5.1)
Sodium: 138 mmol/L (ref 135–145)
Total Bilirubin: 0.4 mg/dL (ref 0.3–1.2)
Total Protein: 6 g/dL — ABNORMAL LOW (ref 6.5–8.1)

## 2018-08-01 LAB — CBC
HCT: 39.2 % (ref 36.0–46.0)
Hemoglobin: 13 g/dL (ref 12.0–15.0)
MCH: 28.6 pg (ref 26.0–34.0)
MCHC: 33.2 g/dL (ref 30.0–36.0)
MCV: 86.3 fL (ref 80.0–100.0)
Platelets: 207 10*3/uL (ref 150–400)
RBC: 4.54 MIL/uL (ref 3.87–5.11)
RDW: 14.5 % (ref 11.5–15.5)
WBC: 14.6 10*3/uL — ABNORMAL HIGH (ref 4.0–10.5)
nRBC: 0.2 % (ref 0.0–0.2)

## 2018-08-01 LAB — GLUCOSE, CAPILLARY
Glucose-Capillary: 62 mg/dL — ABNORMAL LOW (ref 70–99)
Glucose-Capillary: 116 mg/dL — ABNORMAL HIGH (ref 70–99)
Glucose-Capillary: 57 mg/dL — ABNORMAL LOW (ref 70–99)
Glucose-Capillary: 65 mg/dL — ABNORMAL LOW (ref 70–99)

## 2018-08-01 LAB — SARS CORONAVIRUS 2 BY RT PCR (HOSPITAL ORDER, PERFORMED IN ~~LOC~~ HOSPITAL LAB): SARS Coronavirus 2: NEGATIVE

## 2018-08-01 LAB — TYPE AND SCREEN
ABO/RH(D): A NEG
Antibody Screen: NEGATIVE

## 2018-08-01 SURGERY — Surgical Case
Anesthesia: Spinal

## 2018-08-01 MED ORDER — NALBUPHINE HCL 10 MG/ML IJ SOLN: 5.0000 mg | Freq: Once | INTRAMUSCULAR | Status: DC | PRN

## 2018-08-01 MED ORDER — NALBUPHINE HCL 10 MG/ML IJ SOLN: 5.0000 mg | INTRAMUSCULAR | Status: DC | PRN

## 2018-08-01 MED ORDER — FENTANYL CITRATE (PF) 100 MCG/2ML IJ SOLN
INTRAMUSCULAR | Status: DC | PRN
  Administered 2018-08-01: 15 ug via INTRATHECAL

## 2018-08-01 MED ORDER — HYDRALAZINE HCL 20 MG/ML IJ SOLN: 10.0000 mg | INTRAMUSCULAR | Status: DC | PRN

## 2018-08-01 MED ORDER — LEVOTHYROXINE SODIUM 25 MCG PO TABS: 75.0000 ug | ORAL_TABLET | ORAL | Status: DC

## 2018-08-01 MED ORDER — MAGNESIUM SULFATE 40 G IN LACTATED RINGERS - SIMPLE
2.0000 g/h | INTRAVENOUS | Status: AC
  Administered 2018-08-01 – 2018-08-02 (×2): 2 g/h via INTRAVENOUS
  Filled 2018-08-01: qty 500

## 2018-08-01 MED ORDER — PRENATAL MULTIVITAMIN CH
1.0000 | ORAL_TABLET | Freq: Every day | ORAL | Status: DC
  Administered 2018-08-02 – 2018-08-04 (×3): 1 via ORAL
  Filled 2018-08-01 (×3): qty 1

## 2018-08-01 MED ORDER — SIMETHICONE 80 MG PO CHEW: 80.0000 mg | CHEWABLE_TABLET | ORAL | Status: DC | PRN

## 2018-08-01 MED ORDER — MORPHINE SULFATE (PF) 0.5 MG/ML IJ SOLN
INTRAMUSCULAR | Status: AC
  Filled 2018-08-01: qty 10

## 2018-08-01 MED ORDER — SODIUM CHLORIDE 0.9 % IR SOLN
Status: DC | PRN
  Administered 2018-08-01: 1

## 2018-08-01 MED ORDER — DEXAMETHASONE SODIUM PHOSPHATE 10 MG/ML IJ SOLN
INTRAMUSCULAR | Status: DC | PRN
  Administered 2018-08-01: 10 mg via INTRAVENOUS

## 2018-08-01 MED ORDER — MORPHINE SULFATE (PF) 0.5 MG/ML IJ SOLN
INTRAMUSCULAR | Status: DC | PRN
  Administered 2018-08-01: .15 mg via INTRATHECAL

## 2018-08-01 MED ORDER — KETOROLAC TROMETHAMINE 30 MG/ML IJ SOLN
30.0000 mg | Freq: Once | INTRAMUSCULAR | Status: AC
  Administered 2018-08-01: 21:00:00 30 mg via INTRAVENOUS

## 2018-08-01 MED ORDER — MONTELUKAST SODIUM 10 MG PO TABS
10.0000 mg | ORAL_TABLET | Freq: Every day | ORAL | Status: DC
  Administered 2018-08-01 – 2018-08-03 (×3): 10 mg via ORAL
  Filled 2018-08-01 (×5): qty 1

## 2018-08-01 MED ORDER — SODIUM CHLORIDE 0.9 % IV SOLN
INTRAVENOUS | Status: DC | PRN
  Administered 2018-08-01: 19:00:00 via INTRAVENOUS

## 2018-08-01 MED ORDER — OXYTOCIN 40 UNITS IN NORMAL SALINE INFUSION - SIMPLE MED
INTRAVENOUS | Status: AC
  Filled 2018-08-01: qty 1000

## 2018-08-01 MED ORDER — DIPHENHYDRAMINE HCL 25 MG PO CAPS: 25.0000 mg | ORAL_CAPSULE | Freq: Four times a day (QID) | ORAL | Status: DC | PRN

## 2018-08-01 MED ORDER — WITCH HAZEL-GLYCERIN EX PADS: 1.0000 "application " | MEDICATED_PAD | CUTANEOUS | Status: DC | PRN

## 2018-08-01 MED ORDER — PHENYLEPHRINE HCL-NACL 20-0.9 MG/250ML-% IV SOLN
INTRAVENOUS | Status: DC | PRN
  Administered 2018-08-01: 60 ug/min via INTRAVENOUS

## 2018-08-01 MED ORDER — DOCUSATE SODIUM 100 MG PO CAPS: 100.0000 mg | ORAL_CAPSULE | Freq: Every day | ORAL | Status: DC

## 2018-08-01 MED ORDER — PROMETHAZINE HCL 25 MG/ML IJ SOLN: 6.2500 mg | INTRAMUSCULAR | Status: DC | PRN

## 2018-08-01 MED ORDER — FENTANYL CITRATE (PF) 100 MCG/2ML IJ SOLN: 25.0000 ug | INTRAMUSCULAR | Status: DC | PRN

## 2018-08-01 MED ORDER — MAGNESIUM SULFATE BOLUS VIA INFUSION
6.0000 g | Freq: Once | INTRAVENOUS | Status: AC
  Administered 2018-08-01: 17:00:00 6 g via INTRAVENOUS
  Filled 2018-08-01: qty 500

## 2018-08-01 MED ORDER — MENTHOL 3 MG MT LOZG: 1.0000 | LOZENGE | OROMUCOSAL | Status: DC | PRN

## 2018-08-01 MED ORDER — SODIUM CHLORIDE 0.9% FLUSH: 3.0000 mL | Freq: Two times a day (BID) | INTRAVENOUS | Status: DC

## 2018-08-01 MED ORDER — PHENYLEPHRINE HCL-NACL 20-0.9 MG/250ML-% IV SOLN
INTRAVENOUS | Status: AC
  Filled 2018-08-01: qty 250

## 2018-08-01 MED ORDER — SERTRALINE HCL 50 MG PO TABS
25.0000 mg | ORAL_TABLET | Freq: Every day | ORAL | Status: DC
  Administered 2018-08-01 – 2018-08-03 (×3): 25 mg via ORAL
  Filled 2018-08-01 (×4): qty 1

## 2018-08-01 MED ORDER — ACETAMINOPHEN 500 MG PO TABS
1000.0000 mg | ORAL_TABLET | Freq: Four times a day (QID) | ORAL | Status: DC
  Administered 2018-08-01 – 2018-08-04 (×10): 1000 mg via ORAL
  Filled 2018-08-01 (×12): qty 2

## 2018-08-01 MED ORDER — OXYCODONE HCL 5 MG PO TABS
5.0000 mg | ORAL_TABLET | ORAL | Status: DC | PRN
  Administered 2018-08-03 – 2018-08-04 (×3): 5 mg via ORAL
  Filled 2018-08-01 (×3): qty 1

## 2018-08-01 MED ORDER — MOMETASONE FURO-FORMOTEROL FUM 200-5 MCG/ACT IN AERO
2.0000 | INHALATION_SPRAY | Freq: Every day | RESPIRATORY_TRACT | Status: DC
  Administered 2018-08-02 – 2018-08-03 (×2): 2 via RESPIRATORY_TRACT
  Filled 2018-08-01: qty 8.8

## 2018-08-01 MED ORDER — DEXAMETHASONE SODIUM PHOSPHATE 10 MG/ML IJ SOLN
INTRAMUSCULAR | Status: AC
  Filled 2018-08-01: qty 1

## 2018-08-01 MED ORDER — COCONUT OIL OIL: 1.0000 "application " | TOPICAL_OIL | Status: DC | PRN

## 2018-08-01 MED ORDER — LACTATED RINGERS IV SOLN
INTRAVENOUS | Status: DC | PRN
  Administered 2018-08-01 (×2): via INTRAVENOUS

## 2018-08-01 MED ORDER — ONDANSETRON HCL 4 MG/2ML IJ SOLN
INTRAMUSCULAR | Status: DC | PRN
  Administered 2018-08-01: 4 mg via INTRAVENOUS

## 2018-08-01 MED ORDER — ONDANSETRON HCL 4 MG/2ML IJ SOLN
4.0000 mg | Freq: Three times a day (TID) | INTRAMUSCULAR | Status: DC | PRN
  Administered 2018-08-01: 4 mg via INTRAVENOUS
  Filled 2018-08-01: qty 2

## 2018-08-01 MED ORDER — SODIUM CHLORIDE 0.9 % IV SOLN: 250.0000 mL | INTRAVENOUS | Status: DC | PRN

## 2018-08-01 MED ORDER — LEVOTHYROXINE SODIUM 75 MCG PO TABS
75.0000 ug | ORAL_TABLET | ORAL | Status: DC
  Administered 2018-08-03 – 2018-08-04 (×2): 75 ug via ORAL
  Filled 2018-08-01: qty 3
  Filled 2018-08-01 (×2): qty 1

## 2018-08-01 MED ORDER — LABETALOL HCL 5 MG/ML IV SOLN
80.0000 mg | INTRAVENOUS | Status: DC | PRN
  Administered 2018-08-01: 17:00:00 80 mg via INTRAVENOUS
  Filled 2018-08-01: qty 16

## 2018-08-01 MED ORDER — ZOLPIDEM TARTRATE 5 MG PO TABS: 5.0000 mg | ORAL_TABLET | Freq: Every evening | ORAL | Status: DC | PRN

## 2018-08-01 MED ORDER — NALOXONE HCL 4 MG/10ML IJ SOLN
1.0000 ug/kg/h | INTRAVENOUS | Status: DC | PRN
  Filled 2018-08-01: qty 5

## 2018-08-01 MED ORDER — OXYTOCIN 40 UNITS IN NORMAL SALINE INFUSION - SIMPLE MED: 2.5000 [IU]/h | INTRAVENOUS | Status: AC

## 2018-08-01 MED ORDER — CALCIUM CARBONATE ANTACID 500 MG PO CHEW: 2.0000 | CHEWABLE_TABLET | ORAL | Status: DC | PRN

## 2018-08-01 MED ORDER — DEXTROSE 5 % IV SOLN
INTRAVENOUS | Status: DC | PRN
  Administered 2018-08-01: 3 g via INTRAVENOUS

## 2018-08-01 MED ORDER — DIBUCAINE (PERIANAL) 1 % EX OINT: 1.0000 "application " | TOPICAL_OINTMENT | CUTANEOUS | Status: DC | PRN

## 2018-08-01 MED ORDER — LEVOTHYROXINE SODIUM 25 MCG PO TABS: 75.0000 ug | ORAL_TABLET | Freq: Every day | ORAL | Status: DC

## 2018-08-01 MED ORDER — SODIUM CHLORIDE 0.9% FLUSH: 3.0000 mL | INTRAVENOUS | Status: DC | PRN

## 2018-08-01 MED ORDER — KETOROLAC TROMETHAMINE 30 MG/ML IJ SOLN: 30.0000 mg | Freq: Four times a day (QID) | INTRAMUSCULAR | Status: AC | PRN

## 2018-08-01 MED ORDER — INSULIN GLARGINE 100 UNIT/ML ~~LOC~~ SOLN: 100.0000 [IU] | Freq: Every day | SUBCUTANEOUS | Status: DC

## 2018-08-01 MED ORDER — BUPIVACAINE IN DEXTROSE 0.75-8.25 % IT SOLN
INTRATHECAL | Status: DC | PRN
  Administered 2018-08-01: 1.8 mL via INTRATHECAL

## 2018-08-01 MED ORDER — NALOXONE HCL 0.4 MG/ML IJ SOLN: 0.4000 mg | INTRAMUSCULAR | Status: DC | PRN

## 2018-08-01 MED ORDER — LABETALOL HCL 200 MG PO TABS: 200.0000 mg | ORAL_TABLET | Freq: Two times a day (BID) | ORAL | Status: DC

## 2018-08-01 MED ORDER — DIPHENHYDRAMINE HCL 50 MG/ML IJ SOLN: 12.5000 mg | INTRAMUSCULAR | Status: DC | PRN

## 2018-08-01 MED ORDER — FENTANYL CITRATE (PF) 100 MCG/2ML IJ SOLN
INTRAMUSCULAR | Status: AC
  Filled 2018-08-01: qty 2

## 2018-08-01 MED ORDER — MEASLES, MUMPS & RUBELLA VAC IJ SOLR: 0.5000 mL | Freq: Once | INTRAMUSCULAR | Status: DC

## 2018-08-01 MED ORDER — ALBUTEROL SULFATE (2.5 MG/3ML) 0.083% IN NEBU: 3.0000 mL | INHALATION_SOLUTION | Freq: Four times a day (QID) | RESPIRATORY_TRACT | Status: DC | PRN

## 2018-08-01 MED ORDER — METHYLERGONOVINE MALEATE 0.2 MG/ML IJ SOLN: 0.2000 mg | INTRAMUSCULAR | Status: DC | PRN

## 2018-08-01 MED ORDER — LABETALOL HCL 5 MG/ML IV SOLN
20.0000 mg | INTRAVENOUS | Status: DC | PRN
  Administered 2018-08-01 (×2): 20 mg via INTRAVENOUS
  Filled 2018-08-01 (×3): qty 4

## 2018-08-01 MED ORDER — SOD CITRATE-CITRIC ACID 500-334 MG/5ML PO SOLN
ORAL | Status: AC
  Filled 2018-08-01: qty 15

## 2018-08-01 MED ORDER — ACETAMINOPHEN 325 MG PO TABS
650.0000 mg | ORAL_TABLET | ORAL | Status: DC | PRN
  Administered 2018-08-01: 650 mg via ORAL
  Filled 2018-08-01: qty 2

## 2018-08-01 MED ORDER — ACETAMINOPHEN 160 MG/5ML PO SOLN: 1000.0000 mg | Freq: Once | ORAL | Status: DC

## 2018-08-01 MED ORDER — NIFEDIPINE ER OSMOTIC RELEASE 30 MG PO TB24
30.0000 mg | ORAL_TABLET | Freq: Every day | ORAL | Status: DC
  Administered 2018-08-02: 30 mg via ORAL
  Filled 2018-08-01: qty 1

## 2018-08-01 MED ORDER — KETOROLAC TROMETHAMINE 30 MG/ML IJ SOLN
INTRAMUSCULAR | Status: AC
  Filled 2018-08-01: qty 1

## 2018-08-01 MED ORDER — ONDANSETRON HCL 4 MG/2ML IJ SOLN
INTRAMUSCULAR | Status: AC
  Filled 2018-08-01: qty 2

## 2018-08-01 MED ORDER — SODIUM CHLORIDE 0.9 % IV SOLN
INTRAVENOUS | Status: DC | PRN
  Administered 2018-08-01: 40 [IU] via INTRAVENOUS

## 2018-08-01 MED ORDER — LEVOTHYROXINE SODIUM 25 MCG PO TABS: 150.0000 ug | ORAL_TABLET | ORAL | Status: DC

## 2018-08-01 MED ORDER — METHYLERGONOVINE MALEATE 0.2 MG PO TABS: 0.2000 mg | ORAL_TABLET | ORAL | Status: DC | PRN

## 2018-08-01 MED ORDER — MAGNESIUM SULFATE 40 G IN LACTATED RINGERS - SIMPLE
INTRAVENOUS | Status: AC
  Filled 2018-08-01: qty 500

## 2018-08-01 MED ORDER — DIPHENHYDRAMINE HCL 25 MG PO CAPS: 25.0000 mg | ORAL_CAPSULE | ORAL | Status: DC | PRN

## 2018-08-01 MED ORDER — LEVOTHYROXINE SODIUM 150 MCG PO TABS
150.0000 ug | ORAL_TABLET | ORAL | Status: DC
  Administered 2018-08-02: 150 ug via ORAL
  Filled 2018-08-01: qty 6

## 2018-08-01 MED ORDER — SOD CITRATE-CITRIC ACID 500-334 MG/5ML PO SOLN
ORAL | Status: AC
  Administered 2018-08-01: 17:00:00 30 mL
  Filled 2018-08-01: qty 15

## 2018-08-01 MED ORDER — LABETALOL HCL 5 MG/ML IV SOLN
INTRAVENOUS | Status: AC
  Filled 2018-08-01: qty 4

## 2018-08-01 MED ORDER — KETOROLAC TROMETHAMINE 30 MG/ML IJ SOLN
30.0000 mg | Freq: Four times a day (QID) | INTRAMUSCULAR | Status: AC
  Administered 2018-08-02 (×3): 30 mg via INTRAVENOUS
  Filled 2018-08-01 (×3): qty 1

## 2018-08-01 MED ORDER — TETANUS-DIPHTH-ACELL PERTUSSIS 5-2.5-18.5 LF-MCG/0.5 IM SUSP: 0.5000 mL | Freq: Once | INTRAMUSCULAR | Status: DC

## 2018-08-01 MED ORDER — PRENATAL MULTIVITAMIN CH: 1.0000 | ORAL_TABLET | Freq: Every day | ORAL | Status: DC

## 2018-08-01 MED ORDER — OXYCODONE HCL 5 MG PO TABS
5.0000 mg | ORAL_TABLET | ORAL | Status: DC | PRN
  Administered 2018-08-01: 13:00:00 5 mg via ORAL
  Filled 2018-08-01: qty 1

## 2018-08-01 MED ORDER — IBUPROFEN 800 MG PO TABS
800.0000 mg | ORAL_TABLET | Freq: Four times a day (QID) | ORAL | Status: DC
  Administered 2018-08-02 – 2018-08-04 (×6): 800 mg via ORAL
  Filled 2018-08-01 (×7): qty 1

## 2018-08-01 MED ORDER — DEXTROSE 5 % IV SOLN
INTRAVENOUS | Status: AC
  Filled 2018-08-01: qty 3000

## 2018-08-01 MED ORDER — SENNOSIDES-DOCUSATE SODIUM 8.6-50 MG PO TABS
2.0000 | ORAL_TABLET | ORAL | Status: DC
  Administered 2018-08-01 – 2018-08-02 (×2): 2 via ORAL
  Filled 2018-08-01 (×2): qty 2

## 2018-08-01 MED ORDER — LABETALOL HCL 5 MG/ML IV SOLN
40.0000 mg | INTRAVENOUS | Status: DC | PRN
  Administered 2018-08-01: 16:00:00 40 mg via INTRAVENOUS
  Filled 2018-08-01: qty 8

## 2018-08-01 MED ORDER — MAGNESIUM HYDROXIDE 400 MG/5ML PO SUSP: 30.0000 mL | ORAL | Status: DC | PRN

## 2018-08-01 MED ORDER — LACTATED RINGERS IV SOLN
INTRAVENOUS | Status: AC
  Administered 2018-08-01 – 2018-08-02 (×2): via INTRAVENOUS

## 2018-08-01 MED ORDER — ACETAMINOPHEN 500 MG PO TABS: 1000.0000 mg | ORAL_TABLET | Freq: Once | ORAL | Status: DC

## 2018-08-01 MED ORDER — DEXTROSE IN LACTATED RINGERS 5 % IV SOLN
INTRAVENOUS | Status: DC
  Administered 2018-08-01: 13:00:00 via INTRAVENOUS

## 2018-08-01 MED ORDER — ACETAMINOPHEN 500 MG PO TABS: 1000.0000 mg | ORAL_TABLET | Freq: Four times a day (QID) | ORAL | Status: DC

## 2018-08-01 SURGICAL SUPPLY — 34 items
BENZOIN TINCTURE PRP APPL 2/3 (GAUZE/BANDAGES/DRESSINGS) ×2 IMPLANT
CLAMP CORD UMBIL (MISCELLANEOUS) IMPLANT
CLOTH BEACON ORANGE TIMEOUT ST (SAFETY) ×2 IMPLANT
CLSR STERI-STRIP ANTIMIC 1/2X4 (GAUZE/BANDAGES/DRESSINGS) ×2 IMPLANT
DRSG OPSITE POSTOP 4X10 (GAUZE/BANDAGES/DRESSINGS) ×2 IMPLANT
ELECT REM PT RETURN 9FT ADLT (ELECTROSURGICAL) ×2
ELECTRODE REM PT RTRN 9FT ADLT (ELECTROSURGICAL) ×1 IMPLANT
EXTRACTOR VACUUM KIWI (MISCELLANEOUS) IMPLANT
EXTRACTOR VACUUM M CUP 4 TUBE (SUCTIONS) IMPLANT
GLOVE BIO SURGEON STRL SZ8 (GLOVE) ×2 IMPLANT
GLOVE BIOGEL PI IND STRL 7.0 (GLOVE) ×1 IMPLANT
GLOVE BIOGEL PI INDICATOR 7.0 (GLOVE) ×1
GLOVE ORTHO TXT STRL SZ7.5 (GLOVE) ×2 IMPLANT
GOWN STRL REUS W/TWL LRG LVL3 (GOWN DISPOSABLE) ×4 IMPLANT
KIT ABG SYR 3ML LUER SLIP (SYRINGE) IMPLANT
NEEDLE HYPO 25X5/8 SAFETYGLIDE (NEEDLE) ×2 IMPLANT
NS IRRIG 1000ML POUR BTL (IV SOLUTION) ×2 IMPLANT
PACK C SECTION WH (CUSTOM PROCEDURE TRAY) ×2 IMPLANT
PAD OB MATERNITY 4.3X12.25 (PERSONAL CARE ITEMS) ×2 IMPLANT
PENCIL SMOKE EVAC W/HOLSTER (ELECTROSURGICAL) ×2 IMPLANT
RTRCTR C-SECT PINK 25CM LRG (MISCELLANEOUS) ×2 IMPLANT
SUT CHROMIC 1 CTX 36 (SUTURE) ×4 IMPLANT
SUT PLAIN 0 NONE (SUTURE) IMPLANT
SUT PLAIN 2 0 XLH (SUTURE) IMPLANT
SUT VIC AB 0 CT1 27 (SUTURE) ×2
SUT VIC AB 0 CT1 27XBRD ANBCTR (SUTURE) ×2 IMPLANT
SUT VIC AB 2-0 CT1 27 (SUTURE) ×1
SUT VIC AB 2-0 CT1 TAPERPNT 27 (SUTURE) ×1 IMPLANT
SUT VIC AB 3-0 CT1 27 (SUTURE) ×1
SUT VIC AB 3-0 CT1 TAPERPNT 27 (SUTURE) ×1 IMPLANT
SUT VIC AB 4-0 KS 27 (SUTURE) IMPLANT
TOWEL OR 17X24 6PK STRL BLUE (TOWEL DISPOSABLE) ×2 IMPLANT
TRAY FOLEY W/BAG SLVR 14FR LF (SET/KITS/TRAYS/PACK) ×2 IMPLANT
WATER STERILE IRR 1000ML POUR (IV SOLUTION) ×2 IMPLANT

## 2018-08-01 NOTE — Anesthesia Postprocedure Evaluation (Signed)
Anesthesia Post Note  Patient: Melissa Mckay  Procedure(s) Performed: CESAREAN SECTION WITH BILATERAL TUBAL LIGATION (N/A )     Patient location during evaluation: PACU Anesthesia Type: Spinal Level of consciousness: awake and alert Pain management: pain level controlled Vital Signs Assessment: post-procedure vital signs reviewed and stable Respiratory status: spontaneous breathing, nonlabored ventilation and respiratory function stable Cardiovascular status: blood pressure returned to baseline and stable Postop Assessment: no apparent nausea or vomiting and spinal receding Anesthetic complications: no    Last Vitals:  Vitals:   08/01/18 2045 08/01/18 2100  BP: 130/81 134/77  Pulse: 61 64  Resp: 11 15  Temp:  36.6 C  SpO2: 97% 98%    Last Pain:  Vitals:   08/01/18 2100  TempSrc: Oral  PainSc: 0-No pain   Pain Goal:    LLE Motor Response: Purposeful movement (08/01/18 2100) LLE Sensation: Tingling (08/01/18 2100) RLE Motor Response: Purposeful movement (08/01/18 2100) RLE Sensation: Tingling (08/01/18 2100)     Epidural/Spinal Function Cutaneous sensation: Able to Discern Pressure (08/01/18 2100), Patient able to flex knees: No (08/01/18 2100), Patient able to lift hips off bed: No (08/01/18 2100), Back pain beyond tenderness at insertion site: No (08/01/18 2100), Progressively worsening motor and/or sensory loss: No (08/01/18 2100), Bowel and/or bladder incontinence post epidural: No (08/01/18 2100)  Kaylyn Layer

## 2018-08-01 NOTE — Op Note (Signed)
Preoperative diagnosis: Intrauterine pregnancy at 35 weeks, severe preeclampsia, class A2 gestational diabetes, previous cesarean section, desires surgical sterility  Postoperative diagnosis: Same Procedure: Repeat low transverse cesarean section without extensions, bilateral salpingectomy Surgeon: Lavina Hamman M.D. Assistant: Genice Rouge, RNFA Anesthesia: Spinal  Findings: Patient had normal gravid anatomy and delivered a viable female infant with Apgars of 9 and 9 weight pending Estimated blood loss: 200 cc Specimens: Placenta and fallopian tubes sent for routine pathology Complications: None  Procedure in detail: The patient was taken to the operating room and placed in the sitting position. The anesthesiologist instilled spinal anesthesia.  She was then placed in the dorsosupine position with left tilt. Abdomen was then prepped and draped in the usual sterile fashion, and a foley catheter was inserted. The level of her anesthesia was found to be adequate. Abdomen was entered via a standard Pfannenstiel incision through her previous scar. Once the peritoneal cavity was entered the Alexis disposable self-retaining retractor was placed and good visualization was achieved. A 4 cm transverse incision was then made in the lower uterine segment pushing the bladder inferior. Once the uterine cavity was entered the incision was extended digitally. The fetal vertex was grasped and delivered through the incision atraumatically. Mouth and nares were suctioned. The remainder of the infant then delivered atraumatically. Cord was doubly clamped and cut and the infant handed to the awaiting pediatric team. Cord blood was obtained. The placenta delivered spontaneously. Uterus was wiped dry with clean lap pad and all clots and debris were removed. Uterine incision was inspected and found to be free of extensions. Uterine incision was closed in 1 layer with running locking #1 Chromic. Tubes and ovaries were inspected  and found to be normal.  Both fallopian tubes were identified and traced to their fimbriated ends.  The distal portion of each tube was elevated with a Babcock clamp.  Bovie was used to free the distal end of the tube from adhesions to the ovary.  A Kelly clamp was then placed across the proximal fallopian tube in the mesosalpinx.  Both tubes were removed sharply.  Pedicles were tied with 3-0 Vicryl.  Bleeding on the left side was controlled with a figure-of-eight suture of 3-0 Vicryl.  All pedicles were hemostatic.  Uterine incision was inspected and found to be hemostatic. Bleeding from serosal edges was controlled with electrocautery. The Alexis retractor was removed. Subfascial space was irrigated and made hemostatic with electrocautery. Peritoneum was closed with 2-0 Vicryl.  Fascia was closed in running fashion starting at both ends and meeting in the middle with 0 Vicryl. Subcutaneous tissue was then irrigated and made hemostatic with electrocautery, then closed with running 2-0 plain gut. Skin was closed with running 4-0 Vicryl subcuticular suture followed by steri-strips and a sterile dressing. Patient tolerated the procedure well and was taken to the recovery in stable condition. Counts were correct x2, she received Ancef 3 g IV at the beginning of the procedure and she had PAS hose on throughout the procedure.

## 2018-08-01 NOTE — Anesthesia Preprocedure Evaluation (Addendum)
Anesthesia Evaluation  Patient identified by MRN, date of birth, ID band Patient awake    Reviewed: Allergy & Precautions, NPO status , Patient's Chart, lab work & pertinent test results  History of Anesthesia Complications Negative for: history of anesthetic complications  Airway Mallampati: II  TM Distance: >3 FB Neck ROM: Full    Dental no notable dental hx.    Pulmonary asthma , former smoker,    Pulmonary exam normal        Cardiovascular hypertension, Normal cardiovascular exam     Neuro/Psych Depression negative neurological ROS     GI/Hepatic negative GI ROS, Neg liver ROS,   Endo/Other  diabetes, Type 2, Insulin DependentHypothyroidism Morbid obesity  Renal/GU negative Renal ROS     Musculoskeletal negative musculoskeletal ROS (+)   Abdominal   Peds  Hematology negative hematology ROS (+)   Anesthesia Other Findings Day of surgery medications reviewed with the patient.  Reproductive/Obstetrics (+) Pregnancy Pre-eclampsia with severe range BP                            Anesthesia Physical Anesthesia Plan  ASA: III and emergent  Anesthesia Plan: Spinal   Post-op Pain Management:    Induction:   PONV Risk Score and Plan: 3 and Treatment may vary due to age or medical condition, Ondansetron and Dexamethasone  Airway Management Planned: Natural Airway  Additional Equipment:   Intra-op Plan:   Post-operative Plan:   Informed Consent: I have reviewed the patients History and Physical, chart, labs and discussed the procedure including the risks, benefits and alternatives for the proposed anesthesia with the patient or authorized representative who has indicated his/her understanding and acceptance.     Dental advisory given  Plan Discussed with: CRNA  Anesthesia Plan Comments: (Ate lunch at 12:00. Elected to proceed due to severe range BP.)       Anesthesia  Quick Evaluation

## 2018-08-01 NOTE — Transfer of Care (Signed)
Immediate Anesthesia Transfer of Care Note  Patient: Melissa Mckay  Procedure(s) Performed: CESAREAN SECTION WITH BILATERAL TUBAL LIGATION (N/A )  Patient Location: PACU  Anesthesia Type:Spinal  Level of Consciousness: awake, alert  and oriented  Airway & Oxygen Therapy: Patient Spontanous Breathing  Post-op Assessment: Report given to RN and Post -op Vital signs reviewed and stable  Post vital signs: Reviewed and stable  Last Vitals:  Vitals Value Taken Time  BP 146/76 08/01/2018  7:36 PM  Temp    Pulse 68 08/01/2018  7:43 PM  Resp 13 08/01/2018  7:43 PM  SpO2 99 % 08/01/2018  7:43 PM  Vitals shown include unvalidated device data.  Last Pain:  Vitals:   08/01/18 1740  TempSrc:   PainSc: 0-No pain         Complications: No apparent anesthesia complications

## 2018-08-01 NOTE — Anesthesia Procedure Notes (Signed)
Spinal  Patient location during procedure: OR Start time: 08/01/2018 6:20 PM End time: 08/01/2018 6:22 PM Staffing Anesthesiologist: Kaylyn Layer, MD Performed: anesthesiologist  Preanesthetic Checklist Completed: patient identified, site marked, pre-op evaluation, timeout performed, IV checked, risks and benefits discussed and monitors and equipment checked Spinal Block Patient position: sitting Prep: ChloraPrep Patient monitoring: heart rate, cardiac monitor and continuous pulse ox Approach: midline Location: L3-4 Injection technique: single-shot Needle Needle type: Pencan  Needle gauge: 24 G Needle length: 10 cm Additional Notes Risks, benefits, and alternative discussed. Patient gave consent to procedure. Prepped and draped in sitting position. Clear CSF obtained after one needle redirection. Positive terminal aspiration. No pain or paraesthesias with injection. Patient tolerated procedure well. Vital signs stable. Amalia Greenhouse, MD

## 2018-08-01 NOTE — H&P (Signed)
Melissa Mckay is a 38 y.o. female, G3 P1112, EGA [redacted]W[redacted]D with [facility]C 09-05-18 presenting for severe headache.  She has been followed for preeclampsia without severe features for the past week, but was admitted over the weekend and put on PO Labetalol for BP and received betamethasone for pulmonary maturation.  She now has a persistent headache 8/10 that has not responded to tylenol, oxycodone, or IV BP medication.  BP has also been as high as 180/116, now requiring IV Labetalol.  She also has A2GDM, initially tried to control with Metformin without success, fair recent control with Lantus.  She is AMA, Panorama low risk, female.  She had LTCS with last pregnancy for fetal intolerance of labor, baby had CMV.  She still has significant CMV antibidies, but no evidence of active infection per MFM.  She wants BTL with salpingectomy.  OB History    Gravida  3   Para  2   Term  1   Preterm  1   AB  0   Living  2     SAB  0   TAB  0   Ectopic  0   Multiple  0   Live Births  2        Obstetric Comments  C-section for CMV and decrease FM per pt.        Past Medical History:  Diagnosis Date  . Asthma   . Chlamydia   . CMV (cytomegalovirus) antibody positive 2020  . Genital HSV   . Gestational diabetes    GDM  . Hypothyroidism   . Normal pregnancy 09/10/2010  . Preeclampsia   . Pregnancy induced hypertension 2012   preE G1   Past Surgical History:  Procedure Laterality Date  . 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 BREAST SURGERY  age 82   reduction  . CESAREAN SECTION N/A 07/16/2014   Procedure: CESAREAN SECTION;  Surgeon: Melissa Hamman, MD;  Location: [facility];  Service: Obstetrics;  Laterality: N/A;  . CHOLECYSTECTOMY  2017   Family History: family history includes Anemia in an other family member; COPD in an other family member; Cancer in her mother; Depression in an other family member; Diabetes in her father and mother; Diabetes type II in an  other family member; Fibromyalgia in an other family member; Hypertension in an other family member; Thyroid disease in an other family member. Social History:  reports that she has quit smoking. She has never used smokeless tobacco. She reports that she does not drink alcohol or use drugs.     Maternal Diabetes: Yes:  Diabetes Type:  Insulin/Medication controlled Genetic Screening: Normal Maternal Ultrasounds/Referrals: Normal Fetal Ultrasounds or other Referrals:  None Maternal Substance Abuse:  No Significant Maternal Medications:  Meds include: Other:  Significant Maternal Lab Results:  None Other Comments:  Lantus insulin  Review of Systems  Respiratory: Positive for shortness of breath.   Cardiovascular: Negative.   Neurological: Positive for headaches.   Maternal Medical History:  Contractions: Frequency: rare.   Perceived severity is mild.    Fetal activity: Perceived fetal activity is normal.    Prenatal complications: Pre-eclampsia.   Prenatal Complications - Diabetes: gestational. Diabetes is managed by insulin injections.        Blood pressure (!) 149/82, pulse 72, temperature 98.3 F (36.8 C), temperature source Axillary, resp. rate 16, height 5\' 4"  (1.626 m), weight 132.9 kg, last menstrual period 11/06/2017, SpO2 97 %, unknown if currently  breastfeeding. Maternal Exam:  Uterine Assessment: Contraction strength is mild.  Contraction frequency is rare.   Abdomen: Patient reports no abdominal tenderness. Surgical scars: low transverse.   Estimated fetal weight is 6 lbs.    Introitus: Normal vulva. Normal vagina.  Amniotic fluid character: not assessed.     Physical Exam  Vitals reviewed. Constitutional: She appears well-developed and well-nourished.  Cardiovascular: Normal rate and regular rhythm.  Respiratory: Effort normal. No respiratory distress.  GI: Soft.  Genitourinary:    Vulva normal.     Prenatal labs: ABO, Rh: --/--/A NEG (06/01  1308) Antibody: NEG (06/01 1308) Rubella:  Immune RPR:   NR HBsAg:  Neg  HIV:   NR GBS:   None  Assessment/Plan: IUP at [redacted]W[redacted]D with preeclampsia with severe features with severe range BP and 8/10 headache, previous c-section, A2GDM, desires permanent sterility.  Due to her severe headache I don't feel like we can wait any longer for delivery.  Requiring IV Labetalol, now giving 80 mg, to control BP.  Discussed c-section procedure and risks, also wants BTL-discussed options, permanency, failure rate.  Will proceed with repeat c-section and bilateral salpingectomy at 1800-6 hrs after last ate.  Discussed with NICU and anesthesia.  Also starting magnesium for seizure prophylaxis.   Melissa Mckay D Melissa Mckay 08/01/2018, 5:09 PM

## 2018-08-02 ENCOUNTER — Encounter (HOSPITAL_COMMUNITY): Payer: Self-pay | Admitting: *Deleted

## 2018-08-02 LAB — CBC
HCT: 34.3 % — ABNORMAL LOW (ref 36.0–46.0)
Hemoglobin: 11.5 g/dL — ABNORMAL LOW (ref 12.0–15.0)
MCH: 28.8 pg (ref 26.0–34.0)
MCHC: 33.5 g/dL (ref 30.0–36.0)
MCV: 86 fL (ref 80.0–100.0)
Platelets: 198 10*3/uL (ref 150–400)
RBC: 3.99 MIL/uL (ref 3.87–5.11)
RDW: 14.4 % (ref 11.5–15.5)
WBC: 20.3 10*3/uL — ABNORMAL HIGH (ref 4.0–10.5)
nRBC: 0 % (ref 0.0–0.2)

## 2018-08-02 LAB — GLUCOSE, CAPILLARY
Glucose-Capillary: 104 mg/dL — ABNORMAL HIGH (ref 70–99)
Glucose-Capillary: 105 mg/dL — ABNORMAL HIGH (ref 70–99)
Glucose-Capillary: 107 mg/dL — ABNORMAL HIGH (ref 70–99)
Glucose-Capillary: 138 mg/dL — ABNORMAL HIGH (ref 70–99)
Glucose-Capillary: 79 mg/dL (ref 70–99)

## 2018-08-02 MED ORDER — SCOPOLAMINE 1 MG/3DAYS TD PT72
1.0000 | MEDICATED_PATCH | Freq: Once | TRANSDERMAL | Status: DC
  Administered 2018-08-02: 1.5 mg via TRANSDERMAL
  Filled 2018-08-02: qty 1

## 2018-08-02 MED ORDER — RHO D IMMUNE GLOBULIN 1500 UNIT/2ML IJ SOSY
300.0000 ug | PREFILLED_SYRINGE | Freq: Once | INTRAMUSCULAR | Status: AC
  Administered 2018-08-02: 300 ug via INTRAVENOUS
  Filled 2018-08-02: qty 2

## 2018-08-02 NOTE — Progress Notes (Addendum)
Patient ID: Melissa Mckay, female   DOB: 28-Jun-1980, 38 y.o.   MRN: [id number] Pt doing well off MgSO4. She reports no HA. Feels well rested. No comps VS - 155/82 UOP >1700 since 5am  FBG - 107/104/79  A/P: Recheck BP in an hour; consider procardia BID if indicated

## 2018-08-02 NOTE — Progress Notes (Signed)
Patient ID: Melissa Mckay, female   DOB: Aug 26, 1980, 38 y.o.   MRN: [id number] POD#1 Pt reports "I feel like a new woman". Pain well controlled, denies HA or blurry vision. She is tolerating being on MgSO4 well. Baby being bathed in NICU but doing well. She has no complaints VS- 130-133/66-70 ABD - soft, ND, nontender EXT - +1 edema, no homans  20.3>11.5<198  A/P: POD#1 s/p r c/s with BTL for preE and GDM - now stable         BP stabilizing - will continue to monitor; continue on MgSO4         Routine pp care

## 2018-08-03 LAB — GLUCOSE, CAPILLARY
Glucose-Capillary: 115 mg/dL — ABNORMAL HIGH (ref 70–99)
Glucose-Capillary: 106 mg/dL — ABNORMAL HIGH (ref 70–99)

## 2018-08-03 LAB — RH IG WORKUP (INCLUDES ABO/RH)
ABO/RH(D): A NEG
Fetal Screen: NEGATIVE
Gestational Age(Wks): 35
Unit division: 0

## 2018-08-03 MED ORDER — NIFEDIPINE ER OSMOTIC RELEASE 30 MG PO TB24
60.0000 mg | ORAL_TABLET | Freq: Every day | ORAL | Status: DC
  Administered 2018-08-03 – 2018-08-04 (×2): 60 mg via ORAL
  Filled 2018-08-03 (×2): qty 2

## 2018-08-03 NOTE — Progress Notes (Signed)
Subjective: Postpartum Day 2: Cesarean Delivery Patient reports tolerating PO, + BM and no problems voiding.  States feeling much better and no HA or other sx.  Objective: Vital signs in last 24 hours: Temp:  [97.7 F (36.5 C)-99.1 F (37.3 C)] 99.1 F (37.3 C) (06/03 0744) Pulse Rate:  [65-84] 80 (06/03 0744) Resp:  [17-18] 18 (06/03 0744) BP: (124-156)/(54-84) 146/73 (06/03 0744) SpO2:  [97 %-100 %] 99 % (06/03 0744)  Physical Exam:  General: alert and cooperative Lochia: appropriate Uterine Fundus: firm Incision: C/D/I   Recent Labs    08/01/18 1308 08/02/18 0555  HGB 13.0 11.5*  HCT 39.2 34.3*    Assessment/Plan: Status post Cesarean section. Doing well postoperatively.  BP had elevations last pm to 150/80's so procardia increased to 60mg XL.  All BS are WNL so will d/c checks.  Follow BP and adjust meds as needed.    Oliver Pila 08/03/2018, 10:44 AM

## 2018-08-03 NOTE — Progress Notes (Signed)
Patient ID: Melissa Mckay, female   DOB: 1980/06/21, 38 y.o.   MRN: [id number] Chart check: BP noted to improve at 2340pm last night - 134/54 Will continue current mgmt  No change to procardia dosage - keep at 30xl  qd

## 2018-08-04 MED ORDER — OXYCODONE HCL 5 MG PO TABS: 5.0000 mg | ORAL_TABLET | Freq: Four times a day (QID) | ORAL | 0 refills | Status: DC | PRN

## 2018-08-04 MED ORDER — IBUPROFEN 800 MG PO TABS: 800.0000 mg | ORAL_TABLET | Freq: Three times a day (TID) | ORAL | 1 refills | Status: DC | PRN

## 2018-08-04 MED ORDER — PRENATAL VITAMIN 27-0.8 MG PO TABS: 1.0000 | ORAL_TABLET | Freq: Every day | ORAL | 3 refills | Status: DC

## 2018-08-04 MED ORDER — NIFEDIPINE ER 60 MG PO TB24: 60.0000 mg | ORAL_TABLET | Freq: Every day | ORAL | 3 refills | Status: DC

## 2018-08-04 NOTE — Progress Notes (Signed)
Dressing change per MD order

## 2018-08-04 NOTE — Discharge Summary (Signed)
OB Discharge Summary     Patient Name: Melissa Mckay DOB: [birth year] MRN: [id number]  Date of admission: 08/01/2018 Delivering MD: Jackelyn Knife, TODD   Date of discharge: 08/04/2018  Admitting diagnosis: HIGH BP Intrauterine pregnancy: [redacted]w[redacted]d     Secondary diagnosis:  Active Problems:   Preeclampsia, third trimester  Additional problems: Rh neg, GDM    Discharge diagnosis: Term Pregnancy Delivered                                                                                                Post partum procedures:rhogam  And BTL Augmentation: N/A  Complications: None  Hospital course:  Sceduled C/S   38 y.o. yo G3P1203 at [redacted]w[redacted]d was admitted to the hospital 08/01/2018 for scheduled cesarean section with the following indication:PreEclampsia.  Membrane Rupture Time/Date: 6:42 PM ,08/01/2018   Patient delivered a Viable infant.08/01/2018  Details of operation can be found in separate operative note.  Pateint had an uncomplicated postpartum course.  She is ambulating, tolerating a regular diet, passing flatus, and urinating well. Patient is discharged home in stable condition on  08/04/18         Physical exam  Vitals:   08/03/18 2325 08/04/18 0340 08/04/18 0803 08/04/18 0804  BP: (!) 149/80 (!) 146/65  137/86  Pulse: 93 80  82  Resp: 17 18 20    Temp: 98.8 F (37.1 C) 98.7 F (37.1 C) 98.7 F (37.1 C)   TempSrc: Oral Oral Oral   SpO2: 99% 100% 99%   Weight:      Height:       General: alert and no distress Lochia: appropriate Uterine Fundus: firm Incision: Healing well with no significant drainage DVT Evaluation: No evidence of DVT seen on physical exam. Labs: Lab Results  Component Value Date   WBC 20.3 (H) 08/02/2018   HGB 11.5 (L) 08/02/2018   HCT 34.3 (L) 08/02/2018   MCV 86.0 08/02/2018   PLT 198 08/02/2018   CMP Latest Ref Rng & Units 08/01/2018  Glucose 70 - 99 mg/dL 94  BUN 6 - 20 mg/dL 13  Creatinine 1.21 - 9.75 mg/dL 8.83  Sodium 254 - 982 mmol/L 138   Potassium 3.5 - 5.1 mmol/L 4.1  Chloride 98 - 111 mmol/L 102  CO2 22 - 32 mmol/L 24  Calcium 8.9 - 10.3 mg/dL 9.1  Total Protein 6.5 - 8.1 g/dL 6.0(L)  Total Bilirubin 0.3 - 1.2 mg/dL 0.4  Alkaline Phos 38 - 126 U/L 82  AST 15 - 41 U/L 22  ALT 0 - 44 U/L 21    Discharge instruction: per After Visit Summary and "Baby and Me Booklet".  After visit meds:  Allergies as of 08/04/2018      Reactions   Methyldopa Shortness Of Breath, Swelling   Sulfa Antibiotics Hives, Shortness Of Breath      Medication List    STOP taking these medications   insulin glargine 100 UNIT/ML injection Commonly known as:  LANTUS   labetalol 200 MG tablet Commonly known as:  NORMODYNE     TAKE these medications   albuterol 108 (90 Base)  MCG/ACT inhaler Commonly known as:  VENTOLIN HFA Inhale 2 puffs into the lungs every 6 (six) hours as needed for wheezing or shortness of breath. Reported on 09/20/2015   butalbital-acetaminophen-caffeine 50-325-40 MG tablet Commonly known as:  FIORICET Take 2 tablets by mouth every 6 (six) hours as needed for headache.   Dulera 200-5 MCG/ACT Aero Generic drug:  mometasone-formoterol Inhale 2 puffs into the lungs daily.   fluticasone 50 MCG/ACT nasal spray Commonly known as:  FLONASE Place 1 spray into both nostrils daily.   ibuprofen 800 MG tablet Commonly known as:  ADVIL Take 1 tablet (800 mg total) by mouth every 8 (eight) hours as needed.   levothyroxine 75 MCG tablet Commonly known as:  SYNTHROID Take 1 tablet (75 mcg total) by mouth daily. What changed:    how much to take  additional instructions   NIFEdipine 60 MG 24 hr tablet Commonly known as:  ADALAT CC Take 1 tablet (60 mg total) by mouth daily. What changed:    medication strength  how much to take   oxyCODONE 5 MG immediate release tablet Commonly known as:  Oxy IR/ROXICODONE Take 1-2 tablets (5-10 mg total) by mouth every 6 (six) hours as needed for moderate pain or severe  pain.   Prenatal Vitamin 27-0.8 MG Tabs Take 1 tablet by mouth daily. What changed:  medication strength   sertraline 50 MG tablet Commonly known as:  ZOLOFT Take 25 mg by mouth at bedtime.   Singulair 10 MG tablet Generic drug:  montelukast Take 10 mg by mouth at bedtime.   Tylenol 325 MG tablet Generic drug:  acetaminophen Take 650 mg by mouth every 6 (six) hours as needed for mild pain or headache.       Diet: routine diet  Activity: Advance as tolerated. Pelvic rest for 6 weeks.   Outpatient follow up:1,2 and 6 weeks Follow up Appt: Future Appointments  Date Time Provider Department Center  10/18/2018 10:30 AM Carlus PavlovGherghe, Cristina, MD [facility] None   Follow up Visit:No follow-ups on file.  Postpartum contraception: Undecided  Newborn Data: Live born female  Birth Weight: 5 lb 14.5 oz (2679 g) APGAR: 8, 9  Newborn Delivery   Birth date/time:  08/01/2018 18:43:00 Delivery type:  C-Section, Low Transverse Trial of labor:  No C-section categorization:  Repeat     Baby Feeding: Bottle Disposition:home with mother   08/04/2018 Sherian ReinJody [name]-Stuckert, MD

## 2018-08-04 NOTE — Progress Notes (Signed)
Subjective: Postpartum Day 3: Cesarean Delivery Patient reports incisional pain and tolerating PO.    Objective: Vital signs in last 24 hours: Temp:  [98.6 F (37 C)-99.1 F (37.3 C)] 98.7 F (37.1 C) (06/04 0340) Pulse Rate:  [79-93] 80 (06/04 0340) Resp:  [17-18] 18 (06/04 0340) BP: (134-152)/(65-82) 146/65 (06/04 0340) SpO2:  [98 %-100 %] 100 % (06/04 0340)  Physical Exam:  General: alert and no distress Lochia: appropriate Uterine Fundus: firm Incision: healing well DVT Evaluation: No evidence of DVT seen on physical exam.  Recent Labs    08/01/18 1308 08/02/18 0555  HGB 13.0 11.5*  HCT 39.2 34.3*    Assessment/Plan: Status post Cesarean section. Doing well postoperatively.  Continue current care.  D/c with motrin, oxycodone and PNV.  F/u 1 wk fo rBP check, 2 wk for incision check and 6 wk for PP check.    Melissa Mckay 08/04/2018, 7:34 AM

## 2018-08-05 LAB — RPR: RPR Ser Ql: NONREACTIVE

## 2018-08-17 ENCOUNTER — Telehealth: Payer: Self-pay

## 2018-08-17 DIAGNOSIS — Z20822 Contact with and (suspected) exposure to covid-19: Secondary | ICD-10-CM

## 2018-08-17 NOTE — Telephone Encounter (Signed)
Patient returning call to be scheduled for COVID-19 testing. Pt states that her and her husband were referred for testing by her OBGYN. Pt advised that she was only referred by her OBGYN and her husband would need to contact PCP for referral for testing. Pt verbalized understanding and will return call to schedule testing once her husband contacts his PCP so that they can be scheduled for testing at the same time.

## 2018-08-17 NOTE — Telephone Encounter (Signed)
Incoming call from [facility] form Dr. Sherren Mocha Mesinger    .  Requesting [facility]  That Patient be tested for Covid-19 .  Telephone call to Pt.  No answer.  Left message for return call.  To [phone].

## 2018-08-17 NOTE — Telephone Encounter (Signed)
Incoming call from [place]

## 2018-08-18 ENCOUNTER — Other Ambulatory Visit: Payer: BLUE CROSS/BLUE SHIELD

## 2018-08-18 DIAGNOSIS — Z20822 Contact with and (suspected) exposure to covid-19: Secondary | ICD-10-CM

## 2018-08-18 NOTE — Telephone Encounter (Signed)
Contacted by Jaci Standard, [place] at [place] on behalf of Julie Martinique, NP; she would like to have the pt tested for COVID due to positive exposure; pt DOB, address, and contact phone [phone] verified; will attempt to contact pt.

## 2018-08-18 NOTE — Telephone Encounter (Signed)
Attempted to contact pt; left message on voicemail. 

## 2018-08-18 NOTE — Telephone Encounter (Signed)
Contacted pt's husband Aaron Edelman to schedule testing; he would like to bring the pt at 1500 today; he was  offered and accepted appointment on behalf of the pt at [facility] site 08/18/2018 at 1500; pt given address, location, and instructions that he and all occupants of the vehicle should wear masks; he verbalized understanding; orders placed per protocol.

## 2018-08-20 LAB — NOVEL CORONAVIRUS, NAA: SARS-CoV-2, NAA: NOT DETECTED

## 2018-08-29 ENCOUNTER — Inpatient Hospital Stay (HOSPITAL_COMMUNITY): Admit: 2018-08-29 | Payer: BLUE CROSS/BLUE SHIELD | Admitting: Obstetrics and Gynecology

## 2018-10-18 ENCOUNTER — Other Ambulatory Visit: Payer: Self-pay

## 2018-10-18 ENCOUNTER — Encounter: Payer: Self-pay | Admitting: Internal Medicine

## 2018-10-18 ENCOUNTER — Ambulatory Visit: Payer: Medicaid Other | Admitting: Internal Medicine

## 2018-10-18 VITALS — BP 118/80 | HR 88 | Ht 64.0 in | Wt 274.0 lb

## 2018-10-18 DIAGNOSIS — E538 Deficiency of other specified B group vitamins: Secondary | ICD-10-CM | POA: Diagnosis not present

## 2018-10-18 DIAGNOSIS — E038 Other specified hypothyroidism: Secondary | ICD-10-CM

## 2018-10-18 DIAGNOSIS — E559 Vitamin D deficiency, unspecified: Secondary | ICD-10-CM | POA: Diagnosis not present

## 2018-10-18 DIAGNOSIS — R7303 Prediabetes: Secondary | ICD-10-CM | POA: Diagnosis not present

## 2018-10-18 DIAGNOSIS — E230 Hypopituitarism: Secondary | ICD-10-CM

## 2018-10-18 DIAGNOSIS — E063 Autoimmune thyroiditis: Secondary | ICD-10-CM | POA: Diagnosis not present

## 2018-10-18 DIAGNOSIS — E66813 Obesity, class 3: Secondary | ICD-10-CM

## 2018-10-18 DIAGNOSIS — R7989 Other specified abnormal findings of blood chemistry: Secondary | ICD-10-CM

## 2018-10-18 LAB — VITAMIN D 25 HYDROXY (VIT D DEFICIENCY, FRACTURES): VITD: 34.09 ng/mL (ref 30.00–100.00)

## 2018-10-18 LAB — TSH: TSH: 1.04 u[IU]/mL (ref 0.35–4.50)

## 2018-10-18 LAB — VITAMIN B12: Vitamin B-12: 471 pg/mL (ref 211–911)

## 2018-10-18 LAB — POCT GLYCOSYLATED HEMOGLOBIN (HGB A1C): Hemoglobin A1C: 5.8 % — AB (ref 4.0–5.6)

## 2018-10-18 LAB — T4, FREE: Free T4: 1.05 ng/dL (ref 0.60–1.60)

## 2018-10-18 NOTE — Patient Instructions (Signed)
Please stop at the lab.  Continue Levothyroxine 75 mcg x9 tablets a week.  Take the thyroid hormone every day, with water, at least 30 minutes before breakfast, separated by at least 4 hours from: - acid reflux medications - calcium - iron - multivitamins  Please return in 6 months.

## 2018-10-18 NOTE — Progress Notes (Signed)
Patient ID: Melissa Mckay, female   DOB: 09/15/1980, 38 y.o.   MRN: [id number]   HPI  Melissa LackJennifer Marietta is a 38 y.o.-year-old female, returning for follow-up for Hashimoto's hypothyroidism, prediabetes, vitamin D and B12 insufficiency. Last visit 6 months ago.  Since last visit, she gave birth 08/01/2018 - she has a son. PCP: [facility]  She had a C-section in 07/2018 for preeclampsia.  She also had BTL then.   She developed GDM during the pregnancy. She was on Metformin (nausea) and towards the end: insulin x 2 weeks. Now, sugars are WNL.  She is thinking about gastric bypass surgery  - duodenal switch, and plans to have this before the end of the year.  Hypothyroidism: - [facility] 2007-2008 [facility] >> started levothyroxine, initially at 50 mcg daily.  Patient was on levothyroxine 75 mcg daily before her pregnancy and we increased to approximately 96 mcg when she found out that she was pregnant.  At last check, TFTs were at goal.  She is now postpartum but she continues on the same levothyroxine dose.  Pt is on levothyroxine 75 mcg daily +2 extra tablets a week, taken: - in am - fasting - at least 30 min from b'fast - no Ca, Fe, MVI, PPIs - not on Biotin  She was previously on selenium but held it in pregnancy.  She did not restart this yet.  Review TFTs: Lab Results  Component Value Date   TSH 1.500 04/29/2018   TSH 2.44 03/18/2018   TSH 2.80 07/13/2017   TSH 4.00 01/18/2017   TSH 2.76 07/13/2016   TSH 1.40 01/14/2016   TSH 2.22 09/20/2015   FREET4 1.23 04/29/2018   FREET4 0.94 07/13/2017   FREET4 0.78 01/18/2017   FREET4 0.83 07/13/2016   FREET4 1.10 01/14/2016   FREET4 0.69 09/20/2015   T3FREE 2.7 09/20/2015  12/30/2017: TSH was 4.040 and my T4 is 1.16 -at OB/GYN's office, after which we increased her levothyroxine dose  Thyroid antibodies were high, but they improved on selenium 200 mcg daily -off selenium during the  pregnancy. Component     Latest Ref Rng & Units 04/29/2018  Thyroperoxidase Ab SerPl-aCnc     <9 IU/mL 14 (H)   Component     Latest Ref Rng & Units 01/14/2016  Thyroperoxidase Ab SerPl-aCnc     <9 IU/mL 143 (H)   Component     Latest Ref Rng & Units 09/20/2015  Thyroglobulin Ab     <2 IU/mL 1  Thyroperoxidase Ab SerPl-aCnc     <9 IU/mL 245 (H)   Pt denies: - feeling nodules in neck - dysphagia - choking - SOB with lying down + hoarseness  - dx'ed with vocal cord dysfunction  No FH of thyroid cancer. No h/o radiation tx to head or neck.  No herbal supplements. No Biotin use. No recent steroids use.   Prediabetes:  Reviewed latest HbA1c levels: Lab Results  Component Value Date   HGBA1C 5.8 (A) 04/29/2018   HGBA1C 5.4 01/13/2018   HGBA1C 5.5 07/13/2017   HGBA1C 5.4 09/20/2015   She had higher blood sugars during pregnancy.  She had an abnormal OGTT in 06/2018, with a glucose 214 at 2 hours.  vit D deficiency:  Reviewed levels: Lab Results  Component Value Date   VD25OH 23.9 (L) 04/29/2018   VD25OH 28.55 (L) 07/13/2017   VD25OH 31.25 07/13/2016   VD25OH 22.54 (L) 01/14/2016   VD25OH 26.46 (L)  09/20/2015   She was previously on vitamin D 5000 units daily but at last visit she switched to only taking prenatal vitamins.  I advised her to restart the previous dose since her vitamin D was low. Now on 2000 units daily.  B12 deficiency:  Reviewed levels: Lab Results  Component Value Date   VITAMINB12 365 04/29/2018   VITAMINB12 >1500 (H) 07/13/2017   VITAMINB12 610 07/13/2016   VITAMINB12 282 09/20/2015   She was previously on B12 injections, afterwards on 5000 mcg twice a week.  However, at last visit, she was on a prenatal vitamin and I advised her to restart her previous B12 dose. She did not restart. Stopped prenatal vit's last week.  Partially empty sella:  Incidentally found during investigation for headaches.  Reviewed pituitary  work-up-normal: Component of     Latest Ref Rng & Units 01/18/2017  IGF-I, LC/MS     53 - 331 ng/mL 124  Z-Score (Female)     -2.0 - 2 SD -0.3  C206 ACTH     6 - 50 pg/mL 22  Cortisol, Plasma     ug/dL 16.113.5  LH     mIU/mL 0.964.03  FSH     mIU/ML 4.0   She has headaches and sees neurology for this.  She is on Elavil.  She has a daughter born prematurely in 2016 - mother's contracting CMV >> now special-needs child (cerebral palsy).  ROS: Constitutional + weight gain/+ weight loss, no fatigue, no subjective hyperthermia, no subjective hypothermia Eyes: no blurry vision, no xerophthalmia ENT: no sore throat, + see HPI Cardiovascular: no CP/no SOB/no palpitations/no leg swelling Respiratory: no cough/no SOB/no wheezing Gastrointestinal: no N/no V/no D/no C/no acid reflux Musculoskeletal: no muscle aches/+ joint aches Skin: no rashes, no hair loss Neurological: no tremors/no numbness/no tingling/no dizziness  I reviewed pt's medications, allergies, PMH, social hx, family hx, and changes were documented in the history of present illness. Otherwise, unchanged from my initial visit note.  Past Medical History:  Diagnosis Date  . Asthma   . Chlamydia   . CMV (cytomegalovirus) antibody positive 2020  . Genital HSV   . Gestational diabetes    GDM  . Hypothyroidism   . Normal pregnancy 09/10/2010  . Preeclampsia   . Pregnancy induced hypertension 2012   preE G1   Past Surgical History:  Procedure Laterality Date  . 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 BREAST SURGERY  age 89   reduction  . CESAREAN SECTION N/A 07/16/2014   Procedure: CESAREAN SECTION;  Surgeon: Lavina Hammanodd Meisinger, MD;  Location: [facility];  Service: Obstetrics;  Laterality: N/A;  . CESAREAN SECTION WITH BILATERAL TUBAL LIGATION N/A 08/01/2018   Procedure: CESAREAN SECTION WITH BILATERAL TUBAL LIGATION;  Surgeon: Lavina HammanMeisinger, Todd, MD;  Location: [facility];  Service: Obstetrics;  Laterality:  N/A;  MD request RNFA  . CHOLECYSTECTOMY  2017   Social History   Social History  . Marital Status: Married    Spouse Name: N/A  . Number of Children: 2   Occupational History  . [employer], caregiver   Social History Main Topics  . Smoking status: Former Games developermoker  . Smokeless tobacco: Not on file  . Alcohol Use: No  . Drug Use: No   Current Outpatient Medications on File Prior to Visit  Medication Sig Dispense Refill  . acetaminophen (TYLENOL) 325 MG tablet Take 650 mg by mouth every 6 (six) hours as needed for mild pain  or headache.     . albuterol (PROVENTIL HFA;VENTOLIN HFA) 108 (90 BASE) MCG/ACT inhaler Inhale 2 puffs into the lungs every 6 (six) hours as needed for wheezing or shortness of breath. Reported on 09/20/2015    . butalbital-acetaminophen-caffeine (FIORICET) 50-325-40 MG tablet Take 2 tablets by mouth every 6 (six) hours as needed for headache. 14 tablet 0  . fluticasone (FLONASE) 50 MCG/ACT nasal spray Place 1 spray into both nostrils daily.    Marland Kitchen ibuprofen (ADVIL) 800 MG tablet Take 1 tablet (800 mg total) by mouth every 8 (eight) hours as needed. 45 tablet 1  . levothyroxine (SYNTHROID, LEVOTHROID) 75 MCG tablet Take 1 tablet (75 mcg total) by mouth daily. (Patient taking differently: Take 75-150 mcg by mouth daily. Take 2 tablets on  Tuesdays & Fridays) 90 tablet 3  . mometasone-formoterol (DULERA) 200-5 MCG/ACT AERO Inhale 2 puffs into the lungs daily.     . montelukast (SINGULAIR) 10 MG tablet Take 10 mg by mouth at bedtime.     Marland Kitchen NIFEdipine (ADALAT CC) 60 MG 24 hr tablet Take 1 tablet (60 mg total) by mouth daily. 30 tablet 3  . oxyCODONE (OXY IR/ROXICODONE) 5 MG immediate release tablet Take 1-2 tablets (5-10 mg total) by mouth every 6 (six) hours as needed for moderate pain or severe pain. 30 tablet 0  . Prenatal Vit-Fe Fumarate-FA (PRENATAL VITAMIN) 27-0.8 MG TABS Take 1 tablet by mouth daily. 100 tablet 3  . sertraline (ZOLOFT) 50 MG tablet Take 25 mg by mouth at  bedtime.      No current facility-administered medications on file prior to visit.    Allergies  Allergen Reactions  . Methyldopa Shortness Of Breath and Swelling  . Sulfa Antibiotics Hives and Shortness Of Breath   Family History  Problem Relation Age of Onset  . Cancer Mother   . Diabetes Mother   . Anemia Other   . COPD Other   . Depression Other   . Diabetes type II Other   . Fibromyalgia Other   . Hypertension Other   . Thyroid disease Other   . Diabetes Father    PE: BP 118/80   Pulse 88   Ht 5\' 4"  (1.626 m)   Wt 274 lb (124.3 kg)   LMP 11/06/2017   SpO2 98%   Breastfeeding No   BMI 47.03 kg/m  Wt Readings from Last 3 Encounters:  10/18/18 274 lb (124.3 kg)  08/01/18 293 lb (132.9 kg)  07/28/18 299 lb 5 oz (135.8 kg)   Constitutional: overweight, in NAD Eyes: PERRLA, EOMI, no exophthalmos ENT: moist mucous membranes, no thyromegaly, no cervical lymphadenopathy Cardiovascular: RRR, No MRG Respiratory: CTA B Gastrointestinal: abdomen soft, NT, ND, BS+ Musculoskeletal: no deformities, strength intact in all 4 Skin: moist, warm, no rashes Neurological: + Slight tremor with outstretched hands, DTR normal in all 4  ASSESSMENT: 1. Hashimoto's Hypothyroidism  2. Low Vit B12   3. Vit D insufficiency  4.  Partially primary empty sella  5.  Prediabetes  6.  Obesity  PLAN:  1. Patient with longstanding Hashimoto's hypothyroidism, on levothyroxine therapy.  At last visit, she was pregnant, and during pregnancy we increased her levothyroxine by 2 extra tablets a week (equivalent of 96 mcg LT4 daily).  On this dose, her TFTs were at goal during the pregnancy.  We discussed about changing back to her regular dose of LT4 after she gives birth, but she did not do so yet. - latest thyroid labs reviewed with pt >>  normal 04/2018 - she continues on LT4 75 mcg daily +2 extra tablets a week - pt feels good on this dose. - we discussed about taking the thyroid hormone  every day, with water, >30 minutes before breakfast, separated by >4 hours from acid reflux medications, calcium, iron, multivitamins. Pt. is taking it correctly. - will check thyroid tests today: TSH and fT4 - If labs are abnormal, she will need to return for repeat TFTs in 1.5 months  2. Low Vit B12  -Previously on B12 injections per PCP -Afterwards, she was on 5000 mcg twice a week p.o. B12, but off at last visit.  She was only on prenatal vitamins then, sporadically.  I advised her to start the previous p.o. B12 dose, but she did not do this yet. -We will recheck her B12 level today  3. Vit D insufficiency -At last visit, she was off her supplement (5000 units vitamin D daily) for 2 weeks.  She was on prenatal vitamins, sporadically.  I advised her to restart a 5000 units vitamin D daily.  She tells me that she takes 4000 units every other day now. -We will recheck her vitamin D level today  4. Empty sella -Primary, partial -All pituitary hormones were normal -No further investigation is necessary  5.  Prediabetes -She had slightly higher blood sugars during her pregnancy -Most recent HbA1c was in the low prediabetic range: Lab Results  Component Value Date   HGBA1C 5.8 (A) 04/29/2018  -At today's visit, her HbA1c is 5.8% (stable) -She was followed during her pregnancy in the high-risk OB clinic  6.  Obesity -She gained 50 pounds during her pregnancy and lost approximately 20 -She is contemplating gastric bypass surgery.  We discussed about the fact that the duodenal switch surgery is quite extreme (could lead to significant malabsorption) and advised her to consider Roux-en-Y.  She will discuss with her surgeon.  Office Visit on 10/18/2018  Component Date Value Ref Range Status  . TSH 10/18/2018 1.04  0.35 - 4.50 uIU/mL Final  . Free T4 10/18/2018 1.05  0.60 - 1.60 ng/dL Final   Comment: Specimens from patients who are undergoing biotin therapy and /or ingesting biotin  supplements may contain high levels of biotin.  The higher biotin concentration in these specimens interferes with this Free T4 assay.  Specimens that contain high levels  of biotin may cause false high results for this Free T4 assay.  Please interpret results in light of the total clinical presentation of the patient.    . Vitamin B-12 10/18/2018 471  211 - 911 pg/mL Final  . VITD 10/18/2018 34.09  30.00 - 100.00 ng/mL Final  . Hemoglobin A1C 10/18/2018 5.8* 4.0 - 5.6 % Final   Message sent: Dear [name], The thyroid tests are excellent, so for now, I would continue the current regimen.  When you run out of the 75 mcg tablets, we can switch to 100 mcg tablets which is approximately equivalent to the dose you are taking now.  Please let me know. The vitamin D is normal and your vitamin B12 is better. Sincerely, Carlus Pavlovristina Anzleigh Slaven MD  Carlus Pavlovristina Ayonna Speranza, MD PhD [facility]

## 2019-01-16 ENCOUNTER — Telehealth: Payer: Self-pay

## 2019-01-16 MED ORDER — LEVOTHYROXINE SODIUM 100 MCG PO TABS: 100.0000 ug | ORAL_TABLET | Freq: Every day | ORAL | 3 refills | Status: DC

## 2019-01-16 NOTE — Telephone Encounter (Signed)
Per Dr. Cruzita Lederer:  Viewed by Melissa Mckay on 10/18/2018 6:11 PM Written by Philemon Kingdom, MD on 10/18/2018 5:19 PM Dear Melissa Mckay,  The thyroid tests are excellent, so for now, I would continue the current regimen. When you run out of the 75 mcg tablets, we can switch to 100 mcg tablets which is approximately equivalent to the dose you are taking now. Please let me know.  The vitamin D is normal and your vitamin B12 is better.  Sincerely,  Philemon Kingdom MD   RX sent

## 2019-01-16 NOTE — Telephone Encounter (Signed)
Patient called in stating that pharmacy needs new script for the dosage that Dr has her on because she went to get refill and pharmacy said she ha to have new script. And insurance won't pay unless she has the script. Patient states Dr. told her to take 2 extra a week.   Please call and advise    Medication : levothyroxine (SYNTHROID, LEVOTHROID) 75 MCG tablet   Patient is completely out also   [id number] - [place], [place] [address]

## 2019-01-17 ENCOUNTER — Telehealth: Payer: Self-pay | Admitting: Internal Medicine

## 2019-01-17 NOTE — Telephone Encounter (Signed)
Patient requests to be called at ph# [phone] re: clarification of new RX for Levothyroxine dosage/instructions

## 2019-01-18 NOTE — Telephone Encounter (Signed)
Please advise, patient was taking 75 MCG daily along with 2 extra tabs a week. New dose relayed to patient is to take 100 MCG daily and she does not understand.

## 2019-01-18 NOTE — Telephone Encounter (Signed)
I explained this in the previous message to her: Since she is taking 75 mcg daily +2 extra tablets a week, this equals 9 tablets a week.  The equivalent daily dose (9 tablets divided by 7 days) is approximately 96 mcg levothyroxine daily.  I suggested to switch to tablets of 100 mcg, which is the closest dose, for more consistent dosing.

## 2019-01-31 HISTORY — PX: LAPAROSCOPIC GASTRIC SLEEVE RESECTION: SHX5895

## 2019-04-20 ENCOUNTER — Other Ambulatory Visit: Payer: Self-pay

## 2019-04-20 ENCOUNTER — Encounter: Payer: Self-pay | Admitting: Internal Medicine

## 2019-04-20 ENCOUNTER — Telehealth: Payer: Self-pay | Admitting: Internal Medicine

## 2019-04-20 ENCOUNTER — Ambulatory Visit (INDEPENDENT_AMBULATORY_CARE_PROVIDER_SITE_OTHER): Payer: Medicaid Other | Admitting: Internal Medicine

## 2019-04-20 DIAGNOSIS — E063 Autoimmune thyroiditis: Secondary | ICD-10-CM

## 2019-04-20 DIAGNOSIS — E230 Hypopituitarism: Secondary | ICD-10-CM

## 2019-04-20 DIAGNOSIS — E559 Vitamin D deficiency, unspecified: Secondary | ICD-10-CM

## 2019-04-20 DIAGNOSIS — E038 Other specified hypothyroidism: Secondary | ICD-10-CM | POA: Diagnosis not present

## 2019-04-20 DIAGNOSIS — E538 Deficiency of other specified B group vitamins: Secondary | ICD-10-CM

## 2019-04-20 DIAGNOSIS — R7303 Prediabetes: Secondary | ICD-10-CM

## 2019-04-20 NOTE — Progress Notes (Signed)
Patient ID: Verner Kopischke, female   DOB: [birth year], 39 y.o.   MRN: [id number]  Patient location: Home My location: HomePatient Persons participating in the virtual visit: patient, provider  Referring Provider: Martinique, Julie M, NP  I connected with the patient on 04/20/19 at  9:40 AM EST by a video enabled telemedicine application and verified that I am speaking with the correct person.   I discussed the limitations of evaluation and management by telemedicine and the availability of in person appointments. The patient expressed understanding and agreed to proceed.   Details of the encounter are shown below.  HPI  Elverta Dimiceli is a 39 y.o.-year-old female, returning for follow-up for Hashimoto's hypothyroidism, prediabetes, vitamin D and B12 insufficiency. Last visit 6 months ago.  PCP: Dr Julie [place] ([place]  At last visit, she was thinking about gastric bypass surgery-she had a gastric sleeve sx on the 02/28/2019. She lost more than 50 lbs since then.  She feels much better and has more energy.  Hypothyroidism: - [facility] 2007-2008 [facility] >> started levothyroxine, initially at 50 mcg daily.  Patient was on levothyroxine 75 mcg daily before her pregnancy and we increased to approximately 96 mcg when she found out that she was pregnant.  On this dose, TFTs were at goal.  At last visit, sugars were at goal postpartum on 9 levothyroxine tablets of 75 mcg a week.  We changed to 100 mcg daily. . Pt is on levothyroxine 100 mcg daily, taken: - in am - fasting - at least 30 min from b'fast - no Fe, + PPIs (Prilosec) at night, + MVI at night - + calcium 2-3 x a day ! (1-1.5 h after LT4) - not on Biotin  Reviewed her TFTs: Lab Results  Component Value Date   TSH 1.04 10/18/2018   TSH 1.500 04/29/2018   TSH 2.44 03/18/2018   TSH 2.80 07/13/2017   TSH 4.00 01/18/2017   TSH 2.76 07/13/2016   TSH 1.40 01/14/2016   TSH 2.22 09/20/2015   FREET4 1.05  10/18/2018   FREET4 1.23 04/29/2018   FREET4 0.94 07/13/2017   FREET4 0.78 01/18/2017   FREET4 0.83 07/13/2016   FREET4 1.10 01/14/2016   FREET4 0.69 09/20/2015   T3FREE 2.7 09/20/2015  12/30/2017: TSH was 4.040 and my T4 is 1.16 -at OB/GYN's office, after which we increased her levothyroxine dose  Her thyroid antibodies were high, but they improved on selenium 200 mcg daily.  During the pregnancy, she came off the supplement. Component     Latest Ref Rng & Units 04/29/2018  Thyroperoxidase Ab SerPl-aCnc     <9 IU/mL 14 (H)   Component     Latest Ref Rng & Units 01/14/2016  Thyroperoxidase Ab SerPl-aCnc     <9 IU/mL 143 (H)   Component     Latest Ref Rng & Units 09/20/2015  Thyroglobulin Ab     <2 IU/mL 1  Thyroperoxidase Ab SerPl-aCnc     <9 IU/mL 245 (H)   Pt denies: - feeling nodules in neck - dysphagia - choking - SOB with lying down But she does have hoarseness-diagnosed with vocal cord dysfunction.  No FH of thyroid cancer. No h/o radiation tx to head or neck.  No seaweed or kelp. No recent contrast studies. No herbal supplements. No Biotin use. No recent steroids use.   Prediabetes:  Latest HbA1c levels: Lab Results  Component Value Date   HGBA1C 5.8 (A) 10/18/2018   HGBA1C  5.8 (A) 04/29/2018   HGBA1C 5.4 01/13/2018   HGBA1C 5.5 07/13/2017   HGBA1C 5.4 09/20/2015   She had higher blood sugars during pregnancy.  She had an abnormal OGTT in 06/2018, with a glucose 214 at 2 hours.  She was on Victoza for 2 mo before the Sx >> now off.  vit D deficiency:  Reviewed levels: Lab Results  Component Value Date   VD25OH 34.09 10/18/2018   VD25OH 23.9 (L) 04/29/2018   VD25OH 28.55 (L) 07/13/2017   VD25OH 31.25 07/13/2016   VD25OH 22.54 (L) 01/14/2016   VD25OH 26.46 (L) 09/20/2015   She was previously on vitamin D 5000 units daily but at last visit she switched to only taking prenatal vitamins.  I advised her to restart the previous dose since her vitamin D  was low.  She then switched to 2000 units daily.  On this dose, latest vitamin D level was normal. Now on 4000 IU daily + MVI.  B12 deficiency:  Reviewed levels: Lab Results  Component Value Date   VITAMINB12 471 10/18/2018   VITAMINB12 365 04/29/2018   VITAMINB12 >1500 (H) 07/13/2017   VITAMINB12 610 07/13/2016   VITAMINB12 282 09/20/2015   She was previously on B12 injections, afterwards on 5000 mcg twice a week.  However, at last visit, she was on a prenatal vitamin and I advised her to restart her previous B12 dose.  However, she did not restart at last visit.  At that time, she just stopped her prenatal vitamins.  Partially empty sella:  -Incidentally found during investigation for headaches.  Previous pituitary work-up was normal: Component of     Latest Ref Rng & Units 01/18/2017  IGF-I, LC/MS     53 - 331 ng/mL 124  Z-Score (Female)     -2.0 - 2 SD -0.3  C206 ACTH     6 - 50 pg/mL 22  Cortisol, Plasma     ug/dL 08.6  LH     mIU/mL 5.78  FSH     mIU/ML 4.0   She has headaches and sees neurology for this.  She is on Elavil.  She has a daughter born prematurely in 2016 - mother's contracting CMV >> now special-needs child (cerebral palsy).  She had a C-section in 07/2018 for preeclampsia.  She also had BTL then.   Since last visit, she gave birth 08/01/2018 - she has a son. She developed GDM during the pregnancy. She was on Metformin (nausea) and towards the end: insulin x 2 weeks.  Afterwards, sugars normalized.    ROS: Constitutional: no weight gain/+ weight loss, no fatigue, no subjective hyperthermia, no subjective hypothermia Eyes: no blurry vision, no xerophthalmia ENT: no sore throat, + see HPI Cardiovascular: no CP/no SOB/no palpitations/no leg swelling Respiratory: no cough/no SOB/no wheezing Gastrointestinal: no N/no V/no D/no C/no acid reflux Musculoskeletal: no muscle aches/no joint aches Skin: no rashes, + hair loss Neurological: no tremors/no  numbness/no tingling/no dizziness  I reviewed pt's medications, allergies, PMH, social hx, family hx, and changes were documented in the history of present illness. Otherwise, unchanged from my initial visit note.  Past Medical History:  Diagnosis Date  . Asthma   . Chlamydia   . CMV (cytomegalovirus) antibody positive 2020  . Genital HSV   . Gestational diabetes    GDM  . Hypothyroidism   . Normal pregnancy 09/10/2010  . Preeclampsia   . Pregnancy induced hypertension 2012   preE G1   Past Surgical History:  Procedure Laterality Date  .  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 BREAST SURGERY  age 33   reduction  . CESAREAN SECTION N/A 07/16/2014   Procedure: CESAREAN SECTION;  Surgeon: Lavina Hamman, MD;  Location: [facility];  Service: Obstetrics;  Laterality: N/A;  . CESAREAN SECTION WITH BILATERAL TUBAL LIGATION N/A 08/01/2018   Procedure: CESAREAN SECTION WITH BILATERAL TUBAL LIGATION;  Surgeon: Lavina Hamman, MD;  Location: [facility];  Service: Obstetrics;  Laterality: N/A;  MD request RNFA  . CHOLECYSTECTOMY  2017   Social History   Social History  . Marital Status: Married    Spouse Name: N/A  . Number of Children: 2   Occupational History  . [employer], caregiver   Social History Main Topics  . Smoking status: Former Games developer  . Smokeless tobacco: Not on file  . Alcohol Use: No  . Drug Use: No   Current Outpatient Medications on File Prior to Visit  Medication Sig Dispense Refill  . acetaminophen (TYLENOL) 325 MG tablet Take 650 mg by mouth every 6 (six) hours as needed for mild pain or headache.     . albuterol (PROVENTIL HFA;VENTOLIN HFA) 108 (90 BASE) MCG/ACT inhaler Inhale 2 puffs into the lungs every 6 (six) hours as needed for wheezing or shortness of breath. Reported on 09/20/2015    . butalbital-acetaminophen-caffeine (FIORICET) 50-325-40 MG tablet Take 2 tablets by mouth every 6 (six) hours as needed for headache. (Patient not taking:  Reported on 10/18/2018) 14 tablet 0  . fluticasone (FLONASE) 50 MCG/ACT nasal spray Place 1 spray into both nostrils daily.    Marland Kitchen ibuprofen (ADVIL) 800 MG tablet Take 1 tablet (800 mg total) by mouth every 8 (eight) hours as needed. 45 tablet 1  . levothyroxine (SYNTHROID) 100 MCG tablet Take 1 tablet (100 mcg total) by mouth daily. 90 tablet 3  . losartan (COZAAR) 50 MG tablet Cozaar 50 mg tablet  Take 1 tablet every day by oral route for 30 days.  take 1/2 tablet to start    . mometasone-formoterol (DULERA) 200-5 MCG/ACT AERO Inhale 2 puffs into the lungs daily.     . montelukast (SINGULAIR) 10 MG tablet Take 10 mg by mouth at bedtime.     Marland Kitchen NIFEdipine (ADALAT CC) 60 MG 24 hr tablet Take 1 tablet (60 mg total) by mouth daily. 30 tablet 3  . oxyCODONE (OXY IR/ROXICODONE) 5 MG immediate release tablet Take 1-2 tablets (5-10 mg total) by mouth every 6 (six) hours as needed for moderate pain or severe pain. (Patient not taking: Reported on 10/18/2018) 30 tablet 0  . Prenatal Vit-Fe Fumarate-FA (PRENATAL VITAMIN) 27-0.8 MG TABS Take 1 tablet by mouth daily. (Patient not taking: Reported on 10/18/2018) 100 tablet 3  . sertraline (ZOLOFT) 50 MG tablet Take 25 mg by mouth at bedtime.      No current facility-administered medications on file prior to visit.   Allergies  Allergen Reactions  . Methyldopa Shortness Of Breath and Swelling  . Sulfa Antibiotics Hives and Shortness Of Breath   Family History  Problem Relation Age of Onset  . Cancer Mother   . Diabetes Mother   . Anemia Other   . COPD Other   . Depression Other   . Diabetes type II Other   . Fibromyalgia Other   . Hypertension Other   . Thyroid disease Other   . Diabetes Father    PE: Weight today 231 pounds There were no vitals taken for this visit.  Wt Readings from Last 3 Encounters:  10/18/18 274 lb (124.3 kg)  08/01/18 293 lb (132.9 kg)  07/28/18 299 lb 5 oz (135.8 kg)   Constitutional:  in NAD  The physical exam was not  performed (virtual visit).  ASSESSMENT: 1. Hashimoto's Hypothyroidism  2. Low Vit B12   3. Vit D insufficiency  4.  Partially primary empty sella  5.  Prediabetes  PLAN:  1. Patient with longstanding Hashimoto's hypothyroidism, on levothyroxine therapy.  At last visit, she was pregnant, and during pregnancy we increased her levothyroxine by 2 extra tablets a week (equivalent of 96 mcg LT4 daily).  On this dose, her TFTs were at goal during the pregnancy.  At last visit we changed from 75 mcg tablets x9 a week to 100 mcg daily. - latest thyroid labs reviewed with pt >> normal: Lab Results  Component Value Date   TSH 1.04 10/18/2018   - she continues on LT4 100 mcg daily - pt feels good on this dose with plenty of energy - we discussed about taking the thyroid hormone every day, with water, >30 minutes before breakfast, separated by >4 hours from acid reflux medications, calcium, iron, multivitamins. Pt. added calcium since last visit as she has taken this only 1 to 1.5 h after LT4.  We discussed about avoiding calcium by at least 4 h from levothyroxine - will check thyroid tests  In ~ 1 mo: TSH and fT4 - If labs are abnormal, she will need to return for repeat TFTs in 1.5 months  2. Low Vit B12  -Previously on B12 injections per PCP -Afterwards, she was on 5000 mcg twice a week p.o. B12, but off at last visit.  She was only on prenatal vitamins then, sporadically.  I advised her to start the previous p.o. B12 dose, but at last visit she did not start it yet. B12 level was normal in 10/2018.  She is currently on multivitamin. -We will recheck her B12 for the next lab draw  3. Vit D insufficiency -At last visit, she was off her supplement (5000 units vitamin D daily) for 2 weeks.  She was on prenatal vitamins, sporadically.  I advised her to restart a 5000 units vitamin D daily.  At last visit she was taking 4000 units every other day but her vitamin D level returned normal, so we  continued the same dose.  At this visit, however, she is on 4000 units every day + multivitamin -We will recheck an HbA1c at the next lab draw  4. Empty sella -Primary, partial -Reviewed her pituitary hormones, which were normal -No further investigation is necessary  5.  Prediabetes -She had higher blood sugars in pregnancy, however, at last visit, HbA1c was stable, at 5.8%, in the prediabetic range -With her significant weight loss, this will most likely improve. -We will recheck an HbA1c at the next lab draw  Orders Placed This Encounter  Procedures  . Hemoglobin A1c  . TSH  . T4, free  . VITAMIN D 25 Hydroxy (Vit-D Deficiency, Fractures)  . Vitamin B12   Carlus Pavlov, MD PhD [facility] Endocrin[facility]

## 2019-04-20 NOTE — Telephone Encounter (Signed)
LM for pt to send my chart message to let us know when she can come in in the next 6 months for her fu

## 2019-04-20 NOTE — Patient Instructions (Signed)
Please continue Levothyroxine 100 mcg a week.  Take the thyroid hormone every day, with water, at least 30 minutes before breakfast, separated by at least 4 hours from: - acid reflux medications - calcium - iron - multivitamins  Please return in 6 months.  

## 2019-09-18 IMAGING — US US MFM OB LIMITED
1 series · 14 of 25 positions shown · non-contrast
Comparison: none

[Series 1: us mfm ob limited · 25 acquisitions, 14 frames shown]
[im 1/25]
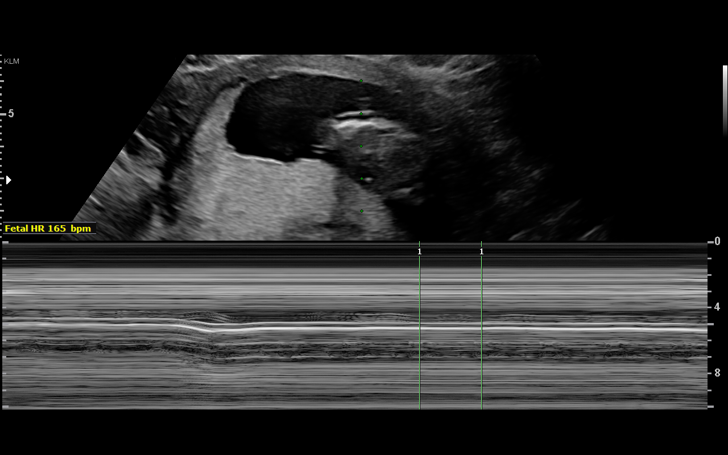
[im 3/25]
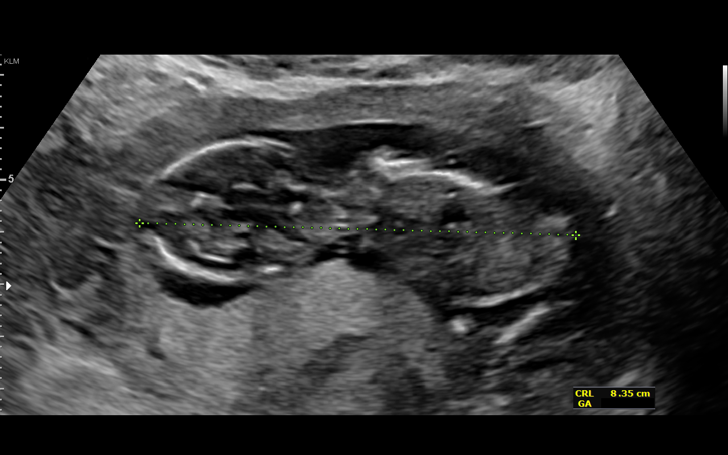
[im 5/25]
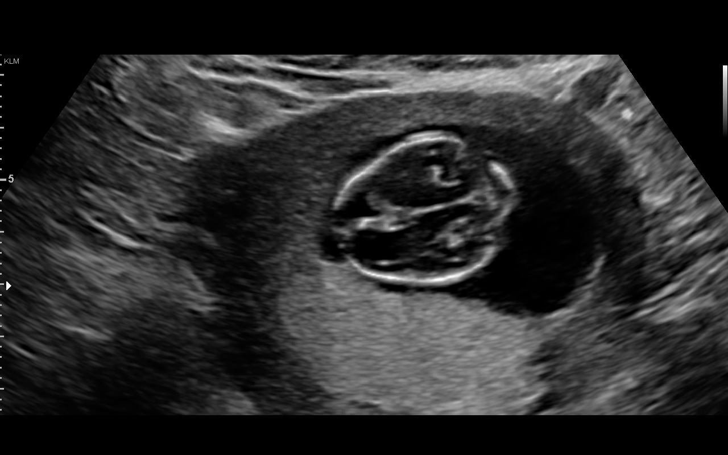
[im 7/25]
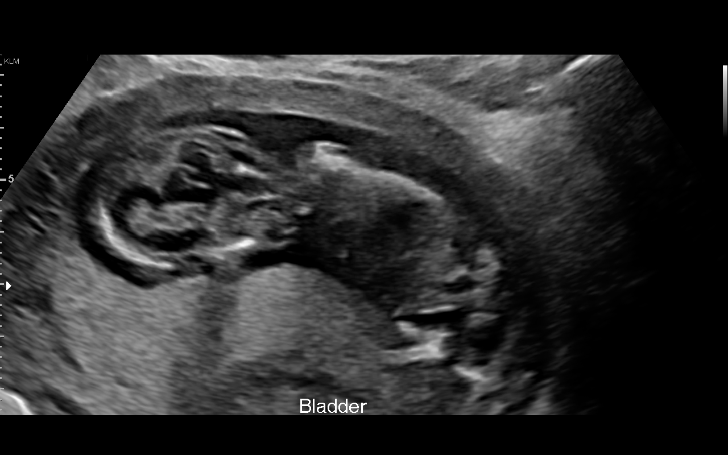
[im 9/25]
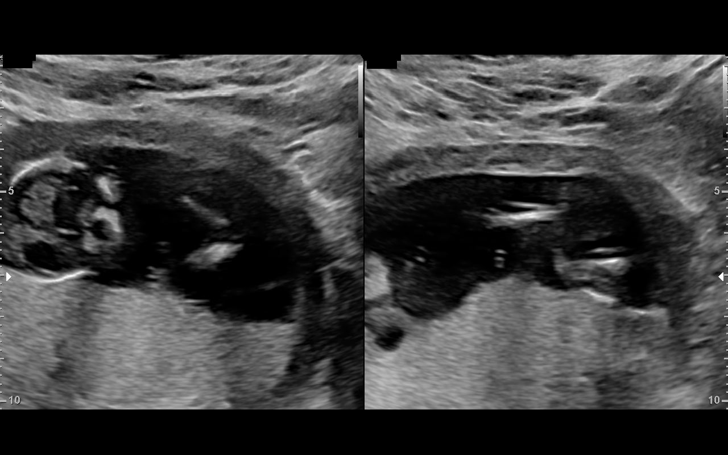
[im 10/25]
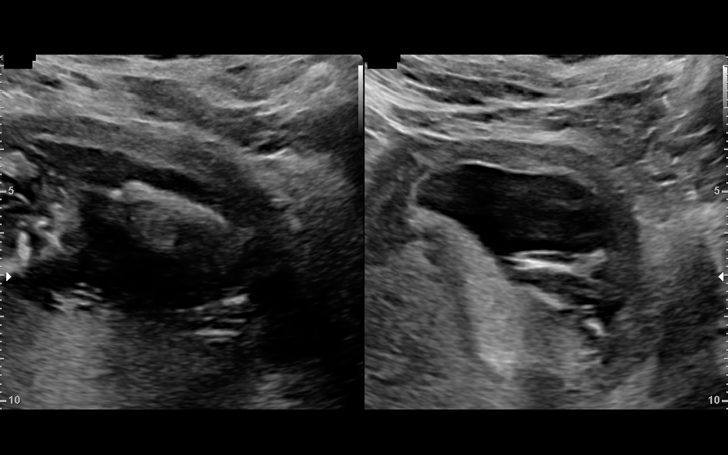
[im 12/25]
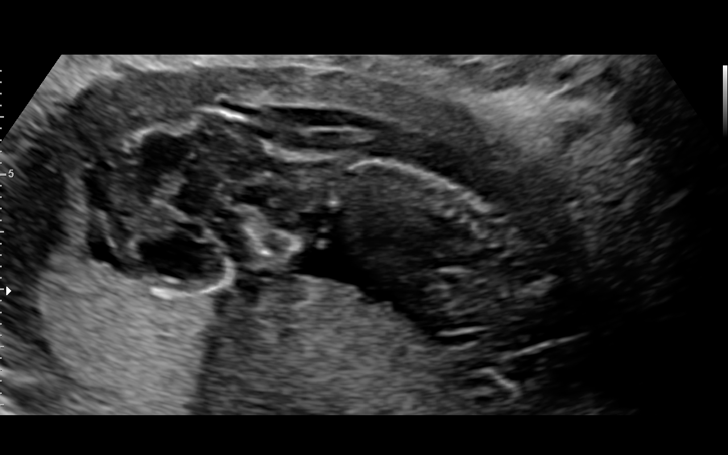
[im 14/25]
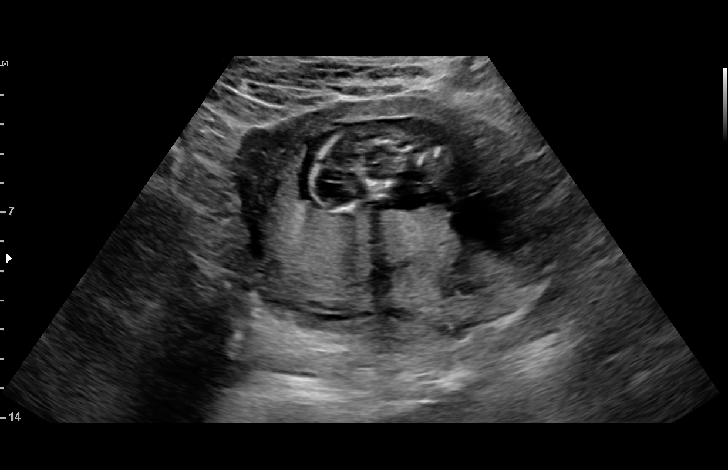
[im 16/25]
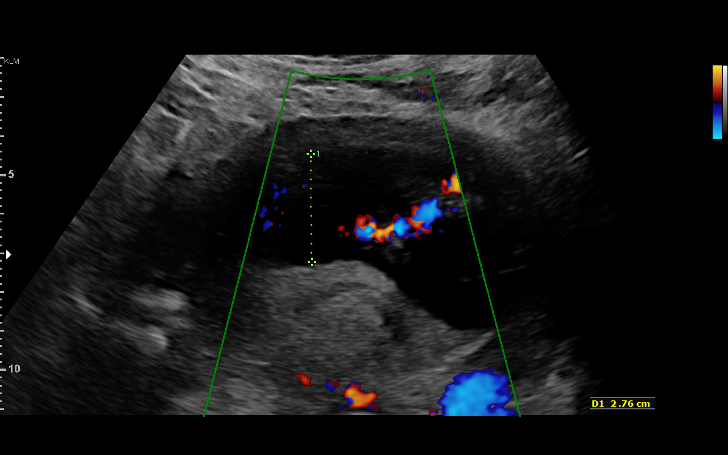
[im 17/25]
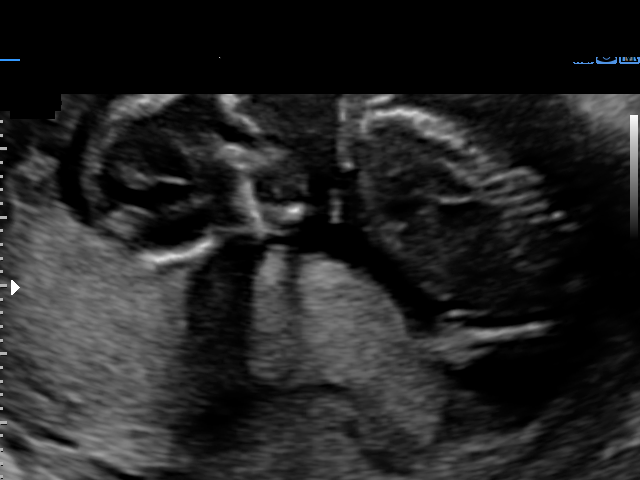
[im 19/25]
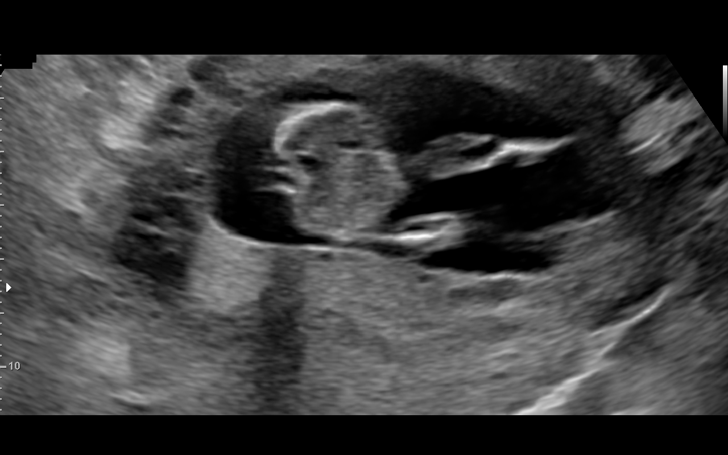
[im 21/25]
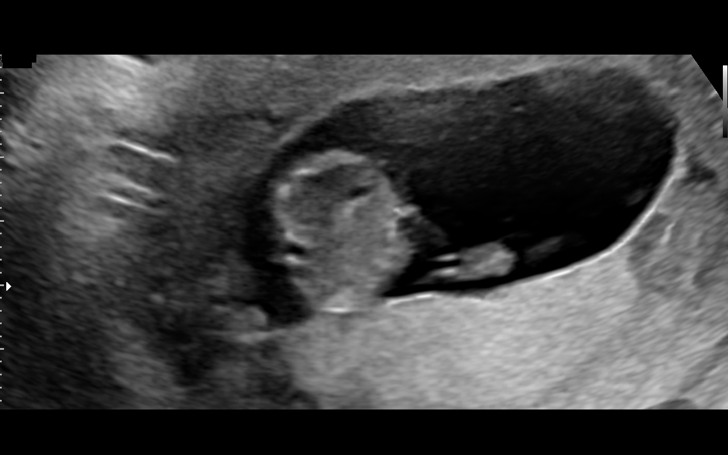
[im 23/25]
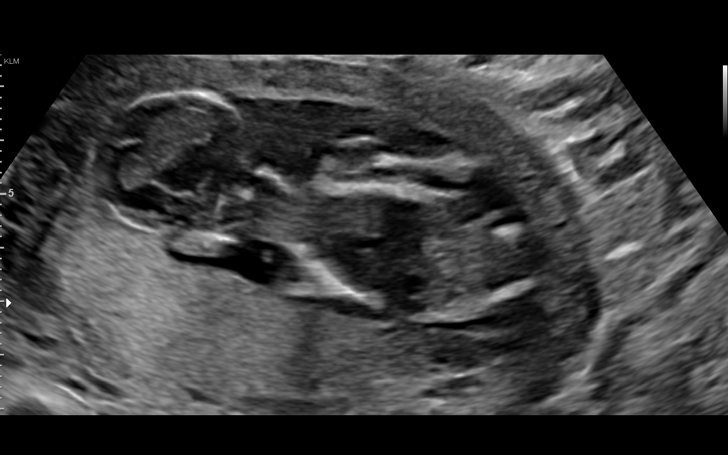
[im 25/25]
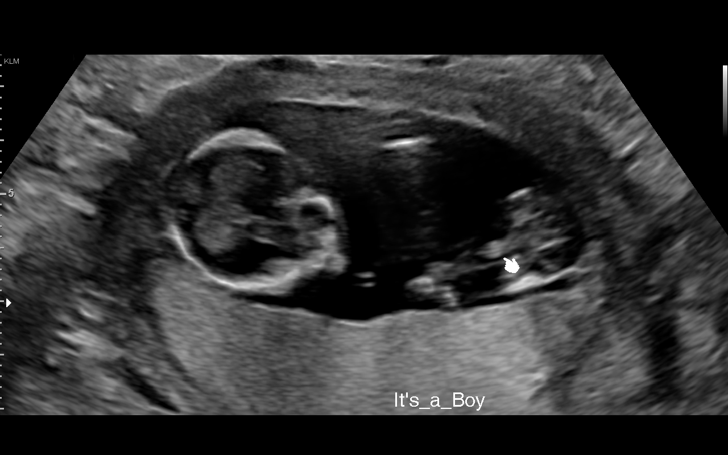

[14 of 25 positions shown; findings below may reference images not displayed]

[HOSPITAL],
                                                            Inc.

  1  US MFM OB LIMITED                    [id number]     [facility]
                                                       [id number]
 ----------------------------------------------------------------------

 ----------------------------------------------------------------------
Indications

  Advanced maternal age multigravida 37+,
  first trimester
  13 weeks gestation of pregnancy
  Maternal viral disease, antepartum, CMV (+
  IgG & IgM)
  Herpes simplex virus (CHAGOLLA)
  Hypothyroid
  Maternal morbid obesity (BMI 40.3)
  Rh negative state in antepartum
  Previous cesarean delivery, antepartum
  Hypertension - Chronic/Pre-existing
  (Methyldopa)
  Poor obstetric history: Previous
  preeclampsia / eclampsia/gestational HTN
  Poor obstetric history: Previous preterm
  delivery, antepartum (32 weeks)
 ----------------------------------------------------------------------
Fetal Evaluation

 Num Of Fetuses:         1
 Fetal Heart Rate(bpm):  165
 Cardiac Activity:       Observed
 Presentation:           Variable
 Placenta:               Posterior

 Amniotic Fluid
 AFI FV:      Within normal limits
Biometry

 CRL:      83.5  mm     G. Age:  13w 6d                  EDD:   09/02/18
OB History

 Gravidity:    3         Term:   1        Prem:   1        SAB:   0
 TOP:          0       Ectopic:  0        Living: 2
Gestational Age

 Best:          13w 3d     Det. By:  Early Ultrasound         EDD:   09/05/18
                                     (01/14/18)
Anatomy

 Cranium:               Appears normal         Abdominal Wall:         Appears nml (cord
                                                                       insert, abd wall)
 Choroid Plexus:        Appears normal         Bladder:                Appears normal
 Thoracic:              Appears normal         Upper Extremities:      Appears normal
 Stomach:               Appears normal, left   Lower Extremities:      Appears normal
                        sided
Cervix Uterus Adnexa

 Cervix
 Closed.

 Adnexa
 No abnormality visualized.
Impression

 consultation on her last visit following increased CMV IgM
 titers (see consult note). Her previous child had congenital
 CMV infection with sequelae.
 On ultrasound, the CRL measurement is consistent with her
 previously-established dates and good fetal heart activity is
 seen. The nuchal translucency (NT) could not be measured
 because of fetal position.  Fetal anatomy that could be
 ascertained at this gestational age is normal.

 Patient reports she will be undergoing cell-free fetal DNA
 screening at your office next week.

 I discussed the significance of increased IgG CMV again
 (possibility of old infection cannot be ruled out). Blood was
 drawn for IgG avidity test to time the infection.

 We will communicate the results to the patient and fax a copy
 to  your office.

 Discussed with Dr. Tiger.
Recommendations

 -An appointment was made for her to return in 7 weeks for
 fetal anatomy scan.
                 Pelangi, Vlina

## 2019-10-01 DIAGNOSIS — U071 COVID-19: Secondary | ICD-10-CM

## 2019-10-01 HISTORY — DX: COVID-19: U07.1

## 2019-10-18 ENCOUNTER — Telehealth (INDEPENDENT_AMBULATORY_CARE_PROVIDER_SITE_OTHER): Payer: Medicaid Other | Admitting: Internal Medicine

## 2019-10-18 ENCOUNTER — Encounter: Payer: Self-pay | Admitting: Internal Medicine

## 2019-10-18 ENCOUNTER — Other Ambulatory Visit: Payer: Self-pay

## 2019-10-18 DIAGNOSIS — E038 Other specified hypothyroidism: Secondary | ICD-10-CM | POA: Diagnosis not present

## 2019-10-18 DIAGNOSIS — E538 Deficiency of other specified B group vitamins: Secondary | ICD-10-CM

## 2019-10-18 DIAGNOSIS — E063 Autoimmune thyroiditis: Secondary | ICD-10-CM

## 2019-10-18 DIAGNOSIS — E230 Hypopituitarism: Secondary | ICD-10-CM | POA: Diagnosis not present

## 2019-10-18 DIAGNOSIS — E559 Vitamin D deficiency, unspecified: Secondary | ICD-10-CM | POA: Diagnosis not present

## 2019-10-18 DIAGNOSIS — R7303 Prediabetes: Secondary | ICD-10-CM

## 2019-10-18 NOTE — Progress Notes (Signed)
Patient ID: Melissa Mckay, female   DOB: [birth year], 39 y.o.   MRN: [id number]  Patient location: Home My location: Office Persons participating in the virtual visit: patient, provider  Referring Provider: [place], Melissa M, NP  I connected with the patient on 10/18/19 at  8:07 AM EDT by a video enabled telemedicine application and verified that I am speaking with the correct person.   I discussed the limitations of evaluation and management by telemedicine and the availability of in person appointments. The patient expressed understanding and agreed to proceed.   Details of the encounter are shown below.  HPI  Melissa Mckay is a 40 y.o.-year-old female, returning for follow-up for Hashimoto's hypothyroidism, prediabetes, vitamin D and B12 insufficiency. Last visit 6 months ago (virtual). PCP: Dr Melissa Mckay [facility]) [facility]  She is recovering from COVID-19 infection.  All of her family was sick, but they had mild symptoms.  She will be out of quarantine after this week.  At last visit, she described losing more than 50 pounds since her gastric sleeve surgery on 02/28/2019.  She felt much better and had more energy.  Since then, she lost another approximately 30 pounds.  Hypothyroidism: - dx 2007-2008 by a [facility] >> started on levothyroxine low-dose initially.  She was on levothyroxine 75 mcg daily before her pregnancy and we increased to approximately 96 mcg when she found out that she was pregnant.  Sugars were at goal postpartum on 9 levothyroxine tablets of 75 mcg a week.  We changed to 100 mcg daily, which she continues today.  She takes the levothyroxine: - in am - fasting - at least 30 min from b'fast - no Ca, Fe - + MVI and PPIs at night - + Calcium 2-3 times a day now moved more than 4 hours after levothyroxine.  At last visit, she was taking this only 1 to 1.5 hours after the thyroid hormone. - not on Biotin  Reviewed her TFTs: 09/22/2019: TSH  2.515 Lab Results  Component Value Date   TSH 1.04 10/18/2018   TSH 1.500 04/29/2018   TSH 2.44 03/18/2018   TSH 2.80 07/13/2017   TSH 4.00 01/18/2017   TSH 2.76 07/13/2016   TSH 1.40 01/14/2016   TSH 2.22 09/20/2015   FREET4 1.05 10/18/2018   FREET4 1.23 04/29/2018   FREET4 0.94 07/13/2017   FREET4 0.78 01/18/2017   FREET4 0.83 07/13/2016   FREET4 1.10 01/14/2016   FREET4 0.69 09/20/2015   T3FREE 2.7 09/20/2015  12/30/2017: TSH was 4.040 and my T4 is 1.16 -at OB/GYN's office, after which we increased her levothyroxine dose  Her thyroid antibodies were high, improved on selenium 200 mcg daily.  During the pregnancy, she came off the supplement. Component     Latest Ref Rng & Units 04/29/2018  Thyroperoxidase Ab SerPl-aCnc     <9 IU/mL 14 (H)   Component     Latest Ref Rng & Units 01/14/2016  Thyroperoxidase Ab SerPl-aCnc     <9 IU/mL 143 (H)   Component     Latest Ref Rng & Units 09/20/2015  Thyroglobulin Ab     <2 IU/mL 1  Thyroperoxidase Ab SerPl-aCnc     <9 IU/mL 245 (H)   Pt denies: - feeling nodules in neck - dysphagia - choking - SOB with lying down However, she does have hoarseness-diagnosed with vocal cord dysfunction.  No FH of thyroid cancer. No h/o radiation tx to head or neck.  No seaweed or kelp.  No recent contrast studies. No herbal supplements. No Biotin use. No recent steroids use.   Prediabetes:  She gave birth 08/01/2018 - she has a son. She developed GDM during the pregnancy. She had an abnormal OGTT in 06/2018, with a glucose 214 at 2 hours. She was on Metformin (nausea) and towards the end: insulin x 2 weeks.  Afterwards, sugars normalized. She was on Victoza for 2 mo >> then off.  Reviewed HbA1c levels: Lab Results  Component Value Date   HGBA1C 5.8 (A) 10/18/2018   HGBA1C 5.8 (A) 04/29/2018   HGBA1C 5.4 01/13/2018   HGBA1C 5.5 07/13/2017   HGBA1C 5.4 09/20/2015   Reviewed most recent CMP (09/22/2019): Glucose 79, BUN/creatinine  12/0.84 per records from PCP.  vit D deficiency:  Reviewed Levels: 06/06/2019: Vitamin D 35 Lab Results  Component Value Date   VD25OH 34.09 10/18/2018   VD25OH 23.9 (L) 04/29/2018   VD25OH 28.55 (L) 07/13/2017   VD25OH 31.25 07/13/2016   VD25OH 22.54 (L) 01/14/2016   VD25OH 26.46 (L) 09/20/2015   She was previously on vitamin D 5000 units daily but then she switched to only taking prenatal vitamins.  I advised her to restart the previous dose since her vitamin D was low.  Prev. on 4000 units daily + multivitamins. Now on 2000 units daily - may take 4000 units in some days.  B12 deficiency:  Reviewed levels: 06/22/2019: B12 447 Lab Results  Component Value Date   VITAMINB12 471 10/18/2018   VITAMINB12 365 04/29/2018   VITAMINB12 >1500 (H) 07/13/2017   VITAMINB12 610 07/13/2016   VITAMINB12 282 09/20/2015   She was previously on B12 injections, afterwards on 5000 mcg twice a week.  However, at last visit, she was on a prenatal vitamin and I advised her to restart her previous B12 dose.  Partially empty sella:  -Incidentally found during investigation for headaches.  She continues to see neurology for headaches.    Previous pituitary work-up was normal: Component of     Latest Ref Rng & Units 01/18/2017  IGF-I, LC/MS     53 - 331 ng/mL 124  Z-Score (Female)     -2.0 - 2 SD -0.3  C206 ACTH     6 - 50 pg/mL 22  Cortisol, Plasma     ug/dL 48.1  LH     mIU/mL 8.56  FSH     mIU/ML 4.0   She has a daughter born prematurely in 2016 - mother's contracting CMV >> now special-needs child (cerebral palsy).   ROS: Constitutional: no weight gain/no weight loss, no fatigue, no subjective hyperthermia, no subjective hypothermia Eyes: no blurry vision, no xerophthalmia ENT: no sore throat, + see HPI Cardiovascular: no CP/no SOB/no palpitations/no leg swelling Respiratory: no cough/no SOB/no wheezing Gastrointestinal: no N/no V/no D/no C/no acid reflux Musculoskeletal: no  muscle aches/no joint aches Skin: no rashes, no hair loss Neurological: no tremors/no numbness/no tingling/no dizziness  I reviewed pt's medications, allergies, PMH, social hx, family hx, and changes were documented in the history of present illness. Otherwise, unchanged from my initial visit note.  Past Medical History:  Diagnosis Date  . Asthma   . Chlamydia   . CMV (cytomegalovirus) antibody positive 2020  . Genital HSV   . Gestational diabetes    GDM  . Hypothyroidism   . Normal pregnancy 09/10/2010  . Preeclampsia   . Pregnancy induced hypertension 2012   preE G1   Past Surgical History:  Procedure Laterality Date  . BREAST REDUCTION SURGERY  benign tumor removed from left breast  . BREAST SURGERY  age 39   reduction  . CESAREAN SECTION N/A 07/16/2014   Procedure: CESAREAN SECTION;  Surgeon: Lavina Hammanodd Meisinger, MD;  Location: [facility];  Service: Obstetrics;  Laterality: N/A;  . CESAREAN SECTION WITH BILATERAL TUBAL LIGATION N/A 08/01/2018   Procedure: CESAREAN SECTION WITH BILATERAL TUBAL LIGATION;  Surgeon: Lavina HammanMeisinger, Todd, MD;  Location: [facility];  Service: Obstetrics;  Laterality: N/A;  MD request RNFA  . CHOLECYSTECTOMY  2017   Social History   Social History  . Marital Status: Married    Spouse Name: N/A  . Number of Children: 2   Occupational History  . [employer], caregiver   Social History Main Topics  . Smoking status: Former Games developermoker  . Smokeless tobacco: Not on file  . Alcohol Use: No  . Drug Use: No   Current Outpatient Medications on File Prior to Visit  Medication Sig Dispense Refill  . acetaminophen (TYLENOL) 325 MG tablet Take 650 mg by mouth every 6 (six) hours as needed for mild pain or headache.     . albuterol (PROVENTIL HFA;VENTOLIN HFA) 108 (90 BASE) MCG/ACT inhaler Inhale 2 puffs into the lungs every 6 (six) hours as needed for wheezing or shortness of breath. Reported on 09/20/2015    . fluticasone (FLONASE) 50 MCG/ACT nasal spray Place 1 spray into  both nostrils daily.    Marland Kitchen. ibuprofen (ADVIL) 800 MG tablet Take 1 tablet (800 mg total) by mouth every 8 (eight) hours as needed. (Patient not taking: Reported on 04/20/2019) 45 tablet 1  . levothyroxine (SYNTHROID) 100 MCG tablet Take 1 tablet (100 mcg total) by mouth daily. 90 tablet 3  . losartan (COZAAR) 50 MG tablet Cozaar 50 mg tablet  Take 1 tablet every day by oral route for 30 days.  take 1/2 tablet to start    . mometasone-formoterol (DULERA) 200-5 MCG/ACT AERO Inhale 2 puffs into the lungs daily.     . montelukast (SINGULAIR) 10 MG tablet Take 10 mg by mouth at bedtime.     Marland Kitchen. NIFEdipine (ADALAT CC) 60 MG 24 hr tablet Take 1 tablet (60 mg total) by mouth daily. 30 tablet 3  . sertraline (ZOLOFT) 50 MG tablet Take 25 mg by mouth at bedtime.      No current facility-administered medications on file prior to visit.   Allergies  Allergen Reactions  . Methyldopa Shortness Of Breath and Swelling  . Sulfa Antibiotics Hives and Shortness Of Breath   Family History  Problem Relation Age of Onset  . Cancer Mother   . Diabetes Mother   . Anemia Other   . COPD Other   . Depression Other   . Diabetes type II Other   . Fibromyalgia Other   . Hypertension Other   . Thyroid disease Other   . Diabetes Father    PE:  10/18/2019: 205 pounds 04/2019: 231 pounds There were no vitals taken for this visit. Wt Readings from Last 3 Encounters:  10/18/18 274 lb (124.3 kg)  08/01/18 293 lb (132.9 kg)  07/28/18 299 lb 5 oz (135.8 kg)   Constitutional:  in NAD  The physical exam was not performed (virtual visit).  ASSESSMENT: 1. Hashimoto's Hypothyroidism  2. Low Vit B12   3. Vit D insufficiency  4.  Partially primary empty sella  5.  Prediabetes  PLAN:  1. Patient with longstanding Hashimoto's hypothyroidism, on levothyroxine therapy.  At last visit, she was pregnant, and during pregnancy we  increased her levothyroxine by 2 extra tablets a week (equivalent of 96 mcg LT4 daily).   On this dose, her TFTs were at goal during the pregnancy.  - latest thyroid labs reviewed with pt >> normal last month  - she continues on LT4 100 mcg daily - pt feels good on this dose. - we discussed about taking the thyroid hormone every day, with water, >30 minutes before breakfast, separated by >4 hours from acid reflux medications, calcium, iron, multivitamins. Pt. is taking it correctly. - We will recheck the levels when she comes back.  We discussed that with significant weight loss, we may need to adjust the levothyroxine dose down.  2. Low Vit B12  -Previously on B12 injections per PCP -Afterwards, she was on 5000 mcg twice a week p.o. B12, but came off before her pregnancy.  She was only on prenatal vitamins then, sporadically.  I advised her to start the previous p.o. B12 dose. B12 level was normal in 10/2018 and at last visit with PCP in 06/2019. -continuing MVI (bariatric) -We will recheck this at next visit  3. Vit D insufficiency -At last visit, she was off her supplement (5000 units vitamin D daily) for 2 weeks.  She was on prenatal vitamins, sporadically.  I advised her to restart a 5000 units vitamin D daily.  At last visit she was taking 4000 units every other day but her vitamin D level returned normal, so we continued the same dose.  -Latest vitamin D level was also normal in 06/2019 and we will recheck this at next visit -We will continue 2000 units vitamin D daily + multivitamin for now, but we discussed that during the winter, she may need to increase the dose to 4000 units vitamin D daily + multivitamin.  4. Empty sella -Primary, partial -Reviewed together her pituitary hormone investigation, which was normal -No further investigation is necessary  5.  Prediabetes -She had higher blood sugars in pregnancy, but latest HbA1c was 5.8% -Before last visit she lost 50 pounds after her gastric sleeve surgery.  This will most likely help improve her HbA1c -She will need to  have another HbA1c checked when she returns to the clinic  Carlus Pavlov, MD PhD [facility]

## 2019-10-18 NOTE — Patient Instructions (Signed)
Please continue Levothyroxine 100 mcg a week.  Take the thyroid hormone every day, with water, at least 30 minutes before breakfast, separated by at least 4 hours from: - acid reflux medications - calcium - iron - multivitamins  Please return in 6 months.

## 2019-10-19 ENCOUNTER — Ambulatory Visit: Payer: Medicaid Other | Admitting: Internal Medicine

## 2020-01-15 ENCOUNTER — Other Ambulatory Visit: Payer: Self-pay | Admitting: Internal Medicine

## 2020-01-15 NOTE — Telephone Encounter (Signed)
New message   1. Which medications need to be refilled? (please list name of each medication and dose if known) levothyroxine (SYNTHROID) 100 MCG tablet  2. Which pharmacy/location (including street and city if local pharmacy) is medication to be sent to?[facility] on [address]

## 2020-04-14 ENCOUNTER — Other Ambulatory Visit: Payer: Self-pay | Admitting: Internal Medicine

## 2020-04-14 ENCOUNTER — Encounter: Payer: Self-pay | Admitting: Internal Medicine

## 2020-04-22 ENCOUNTER — Other Ambulatory Visit: Payer: Self-pay

## 2020-04-22 ENCOUNTER — Other Ambulatory Visit (HOSPITAL_COMMUNITY)
Admission: RE | Admit: 2020-04-22 | Discharge: 2020-04-22 | Disposition: A | Discharge status: Home / Self Care | Payer: Medicaid Other | Source: Ambulatory Visit | Attending: Obstetrics and Gynecology | Admitting: Obstetrics and Gynecology

## 2020-04-22 ENCOUNTER — Encounter (HOSPITAL_BASED_OUTPATIENT_CLINIC_OR_DEPARTMENT_OTHER): Payer: Self-pay | Admitting: Obstetrics and Gynecology

## 2020-04-22 DIAGNOSIS — Z01812 Encounter for preprocedural laboratory examination: Secondary | ICD-10-CM | POA: Insufficient documentation

## 2020-04-22 DIAGNOSIS — Z20822 Contact with and (suspected) exposure to covid-19: Secondary | ICD-10-CM | POA: Insufficient documentation

## 2020-04-22 LAB — SARS CORONAVIRUS 2 (TAT 6-24 HRS): SARS Coronavirus 2: NEGATIVE

## 2020-04-22 NOTE — Progress Notes (Addendum)
Spoke w/ via phone for pre-op interview---pt Lab needs dos----cbc bmp urine preg                Lab results------none COVID test ------04-22-2020 845 Arrive at -------600 am 04-25-2020  NPO after MN NO Solid Food.  Clear liquids from MN until---500 am then npo Medications to take morning of surgery -----levothyroxine, albuterol inhaler pnr/bring inhaler, use dulera, xanax prn, claritin, ipratropium nasal spray Diabetic medication -----n/a Patient Special Instructions -----bring cpap mask tubing and machine and leave in car Pre-Op special Istructions ----- Patient verbalized understanding of instructions that were given at this phone interview. Patient denies shortness of breath, chest pain, fever, cough at this phone interview.  Stress test 12-21-2019 [facility] results on chart

## 2020-04-24 NOTE — H&P (Signed)
Melissa Mckay is an 40 y.o. female. She was seen for annual gyn exam in December, c/o menorrhagia.  Saline infusion u/s with normal endometrial cavity, benign pipelle.  She wants to proceed with endometrial ablation  Pertinent Gynecological History: Last pap: normal Date: 12/2015, neg HPV OB History: G3, P1203 SVD, LTCS x 2   Menstrual History: Patient's last menstrual period was 04/03/2020.    Past Medical History:  Diagnosis Date  . Asthma   . Chlamydia   . CMV (cytomegalovirus) antibody positive 2020  . COVID 10/2019   all symptoms x 2 weeks all symptoms resolved except loss of smell and taste at times , no hospitliization  . Febrile seizure (HCC)    as child once  or twice with high fever none since age 83 or 6  . Genital HSV   . History of gestational diabetes   . Hypertension   . Hypothyroidism   . Menorrhagia   . Normal pregnancy 09/10/2010  . Preeclampsia dx 03-2017  . Pregnancy induced hypertension 2012   preE G1  . Sleep apnea    cpap set on 8.1    Past Surgical History:  Procedure Laterality Date  . 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 BREAST SURGERY  age 75   reduction  . CESAREAN SECTION N/A 07/16/2014   Procedure: CESAREAN SECTION;  Surgeon: Lavina Hamman, MD;  Location: [facility];  Service: Obstetrics;  Laterality: N/A;  . CESAREAN SECTION WITH BILATERAL TUBAL LIGATION N/A 08/01/2018   Procedure: CESAREAN SECTION WITH BILATERAL TUBAL LIGATION;  Surgeon: Lavina Hamman, MD;  Location: [facility];  Service: Obstetrics;  Laterality: N/A;  MD request RNFA  . CHOLECYSTECTOMY  2017  . LAPAROSCOPIC GASTRIC SLEEVE RESECTION  01/2019  . TYMPANOSTOMY TUBE PLACEMENT  age 83    Family History  Problem Relation Age of Onset  . Cancer Mother   . Diabetes Mother   . Anemia Other   . COPD Other   . Depression Other   . Diabetes type II Other   . Fibromyalgia Other   . Hypertension Other   . Thyroid disease Other   . Diabetes Father      Social History:  reports that she has quit smoking. She has never used smokeless tobacco. She reports that she does not drink alcohol and does not use drugs.  Allergies:  Allergies  Allergen Reactions  . Methyldopa Shortness Of Breath and Swelling  . Sulfa Antibiotics Hives and Shortness Of Breath    No medications prior to admission.    Review of Systems  Respiratory: Negative.   Cardiovascular: Negative.     Height 5\' 4"  (1.626 m), weight 99.3 kg, last menstrual period 04/03/2020, not currently breastfeeding. Physical Exam Constitutional:      Appearance: She is obese.  Cardiovascular:     Rate and Rhythm: Normal rate and regular rhythm.     Heart sounds: Normal heart sounds. No murmur heard.   Pulmonary:     Effort: Pulmonary effort is normal. No respiratory distress.     Breath sounds: Normal breath sounds.  Abdominal:     General: There is no distension.     Palpations: Abdomen is soft. There is no mass.     Tenderness: There is no abdominal tenderness.  Genitourinary:    Comments: Normal uterus, no adnexal mass Musculoskeletal:     Cervical back: Normal range of motion and neck supple.  Neurological:     Mental  Status: She is alert.     No results found for this or any previous visit (from the past 24 hour(s)).  No results found.  Assessment/Plan: Menorrhagia.  All medical and surgical options have been discussed, she wants endometrial ablation.  Surgical procedure, risks, chances of improving her menses have all been discussed, questions answered.  Will admit for Hysteroscopy and Cyndi Bender 04/24/2020, 8:32 AM

## 2020-04-24 NOTE — Anesthesia Preprocedure Evaluation (Addendum)
Anesthesia Evaluation  Patient identified by MRN, date of birth, ID band Patient awake    Reviewed: Allergy & Precautions, NPO status , Patient's Chart, lab work & pertinent test results  Airway Mallampati: II  TM Distance: >3 FB Neck ROM: Full    Dental  (+) Teeth Intact   Pulmonary asthma , sleep apnea and Continuous Positive Airway Pressure Ventilation , former smoker,    Pulmonary exam normal        Cardiovascular hypertension, Pt. on medications  Rhythm:Regular Rate:Normal     Neuro/Psych Seizures - (infantile, nothing since age 40), Well Controlled,  negative psych ROS   GI/Hepatic Neg liver ROS, GERD  Medicated,  Endo/Other  Hypothyroidism   Renal/GU negative Renal ROS  Female GU complaint Menorrhagia     Musculoskeletal negative musculoskeletal ROS (+)   Abdominal (+)  Abdomen: soft. Bowel sounds: normal.  Peds  Hematology negative hematology ROS (+)   Anesthesia Other Findings   Reproductive/Obstetrics                            Anesthesia Physical Anesthesia Plan  ASA: II  Anesthesia Plan: General   Post-op Pain Management:    Induction: Intravenous  PONV Risk Score and Plan: 3 and Ondansetron, Dexamethasone, Midazolam and Treatment may vary due to age or medical condition  Airway Management Planned: Mask and LMA  Additional Equipment: None  Intra-op Plan:   Post-operative Plan: Extubation in OR  Informed Consent: I have reviewed the patients History and Physical, chart, labs and discussed the procedure including the risks, benefits and alternatives for the proposed anesthesia with the patient or authorized representative who has indicated his/her understanding and acceptance.     Dental advisory given  Plan Discussed with: CRNA  Anesthesia Plan Comments: (Lab Results      Component                Value               Date                      PREGTESTUR                NEGATIVE            04/25/2020           )       Anesthesia Quick Evaluation

## 2020-04-25 ENCOUNTER — Encounter (HOSPITAL_BASED_OUTPATIENT_CLINIC_OR_DEPARTMENT_OTHER)
Admission: RE | Disposition: A | Discharge status: Home / Self Care | Payer: Self-pay | Source: Home / Self Care | Attending: Obstetrics and Gynecology

## 2020-04-25 ENCOUNTER — Other Ambulatory Visit: Payer: Self-pay

## 2020-04-25 ENCOUNTER — Ambulatory Visit (HOSPITAL_BASED_OUTPATIENT_CLINIC_OR_DEPARTMENT_OTHER): Payer: Medicaid Other | Admitting: Anesthesiology

## 2020-04-25 ENCOUNTER — Ambulatory Visit (HOSPITAL_BASED_OUTPATIENT_CLINIC_OR_DEPARTMENT_OTHER)
Admission: RE | Admit: 2020-04-25 | Discharge: 2020-04-25 | Disposition: A | Discharge status: Home / Self Care | Payer: Medicaid Other | Attending: Obstetrics and Gynecology | Admitting: Obstetrics and Gynecology

## 2020-04-25 ENCOUNTER — Encounter (HOSPITAL_BASED_OUTPATIENT_CLINIC_OR_DEPARTMENT_OTHER): Payer: Self-pay | Admitting: Obstetrics and Gynecology

## 2020-04-25 DIAGNOSIS — N920 Excessive and frequent menstruation with regular cycle: Secondary | ICD-10-CM | POA: Diagnosis not present

## 2020-04-25 DIAGNOSIS — Z882 Allergy status to sulfonamides status: Secondary | ICD-10-CM | POA: Insufficient documentation

## 2020-04-25 DIAGNOSIS — Z8616 Personal history of COVID-19: Secondary | ICD-10-CM | POA: Diagnosis not present

## 2020-04-25 DIAGNOSIS — Z79899 Other long term (current) drug therapy: Secondary | ICD-10-CM | POA: Insufficient documentation

## 2020-04-25 DIAGNOSIS — Z87891 Personal history of nicotine dependence: Secondary | ICD-10-CM | POA: Insufficient documentation

## 2020-04-25 DIAGNOSIS — Z888 Allergy status to other drugs, medicaments and biological substances status: Secondary | ICD-10-CM | POA: Insufficient documentation

## 2020-04-25 HISTORY — DX: Sleep apnea, unspecified: G47.30

## 2020-04-25 HISTORY — DX: Excessive and frequent menstruation with regular cycle: N92.0

## 2020-04-25 HISTORY — PX: DILITATION & CURRETTAGE/HYSTROSCOPY WITH NOVASURE ABLATION: SHX5568

## 2020-04-25 HISTORY — DX: Essential (primary) hypertension: I10

## 2020-04-25 HISTORY — DX: Simple febrile convulsions: R56.00

## 2020-04-25 HISTORY — DX: Personal history of gestational diabetes: Z86.32

## 2020-04-25 LAB — BASIC METABOLIC PANEL
Anion gap: 12 (ref 5–15)
BUN: 15 mg/dL (ref 6–20)
CO2: 22 mmol/L (ref 22–32)
Calcium: 9.3 mg/dL (ref 8.9–10.3)
Chloride: 106 mmol/L (ref 98–111)
Creatinine, Ser: 0.65 mg/dL (ref 0.44–1.00)
GFR, Estimated: >60 mL/min (ref >60)
Glucose, Bld: 90 mg/dL (ref 70–99)
Potassium: 3.4 mmol/L — ABNORMAL LOW (ref 3.5–5.1)
Sodium: 140 mmol/L (ref 135–145)

## 2020-04-25 LAB — CBC
HCT: 42.4 % (ref 36.0–46.0)
Hemoglobin: 14.2 g/dL (ref 12.0–15.0)
MCH: 28.3 pg (ref 26.0–34.0)
MCHC: 33.5 g/dL (ref 30.0–36.0)
MCV: 84.5 fL (ref 80.0–100.0)
Platelets: 206 10*3/uL (ref 150–400)
RBC: 5.02 MIL/uL (ref 3.87–5.11)
RDW: 16.5 % — ABNORMAL HIGH (ref 11.5–15.5)
WBC: 6.8 10*3/uL (ref 4.0–10.5)
nRBC: 0 % (ref 0.0–0.2)

## 2020-04-25 LAB — POCT PREGNANCY, URINE: Preg Test, Ur: NEGATIVE

## 2020-04-25 SURGERY — DILATATION & CURETTAGE/HYSTEROSCOPY WITH NOVASURE ABLATION
Anesthesia: General

## 2020-04-25 MED ORDER — MIDAZOLAM HCL 2 MG/2ML IJ SOLN
INTRAMUSCULAR | Status: DC | PRN
  Administered 2020-04-25: 2 mg via INTRAVENOUS

## 2020-04-25 MED ORDER — SODIUM CHLORIDE 0.9 % IR SOLN
Status: DC | PRN
  Administered 2020-04-25: 3000 mL

## 2020-04-25 MED ORDER — FENTANYL CITRATE (PF) 100 MCG/2ML IJ SOLN
INTRAMUSCULAR | Status: DC | PRN
  Administered 2020-04-25 (×2): 50 ug via INTRAVENOUS

## 2020-04-25 MED ORDER — LIDOCAINE 2% (20 MG/ML) 5 ML SYRINGE
INTRAMUSCULAR | Status: DC | PRN
  Administered 2020-04-25 (×2): 50 mg via INTRAVENOUS

## 2020-04-25 MED ORDER — KETOROLAC TROMETHAMINE 30 MG/ML IJ SOLN
INTRAMUSCULAR | Status: DC | PRN
  Administered 2020-04-25: 30 mg via INTRAVENOUS

## 2020-04-25 MED ORDER — MIDAZOLAM HCL 2 MG/2ML IJ SOLN
INTRAMUSCULAR | Status: AC
  Filled 2020-04-25: qty 2

## 2020-04-25 MED ORDER — HYDROMORPHONE HCL 1 MG/ML IJ SOLN: 0.2500 mg | INTRAMUSCULAR | Status: DC | PRN

## 2020-04-25 MED ORDER — LIDOCAINE HCL (PF) 2 % IJ SOLN
INTRAMUSCULAR | Status: AC
  Filled 2020-04-25: qty 5

## 2020-04-25 MED ORDER — DEXAMETHASONE SODIUM PHOSPHATE 10 MG/ML IJ SOLN
INTRAMUSCULAR | Status: DC | PRN
  Administered 2020-04-25: 10 mg via INTRAVENOUS

## 2020-04-25 MED ORDER — ONDANSETRON HCL 4 MG/2ML IJ SOLN
INTRAMUSCULAR | Status: DC | PRN
  Administered 2020-04-25: 4 mg via INTRAVENOUS

## 2020-04-25 MED ORDER — LACTATED RINGERS IV SOLN: INTRAVENOUS | Status: DC

## 2020-04-25 MED ORDER — KETOROLAC TROMETHAMINE 30 MG/ML IJ SOLN
INTRAMUSCULAR | Status: AC
  Filled 2020-04-25: qty 1

## 2020-04-25 MED ORDER — ONDANSETRON HCL 4 MG/2ML IJ SOLN
INTRAMUSCULAR | Status: AC
  Filled 2020-04-25: qty 2

## 2020-04-25 MED ORDER — PROMETHAZINE HCL 25 MG/ML IJ SOLN: 6.2500 mg | INTRAMUSCULAR | Status: DC | PRN

## 2020-04-25 MED ORDER — DEXAMETHASONE SODIUM PHOSPHATE 10 MG/ML IJ SOLN
INTRAMUSCULAR | Status: AC
  Filled 2020-04-25: qty 1

## 2020-04-25 MED ORDER — FENTANYL CITRATE (PF) 100 MCG/2ML IJ SOLN
INTRAMUSCULAR | Status: AC
  Filled 2020-04-25: qty 2

## 2020-04-25 MED ORDER — PROPOFOL 10 MG/ML IV BOLUS
INTRAVENOUS | Status: DC | PRN
Start: 2020-04-25 — End: 2020-04-25
  Administered 2020-04-25: 30 mg via INTRAVENOUS
  Administered 2020-04-25: 170 mg via INTRAVENOUS

## 2020-04-25 MED ORDER — POVIDONE-IODINE 10 % EX SWAB: 2.0000 "application " | Freq: Once | CUTANEOUS | Status: DC

## 2020-04-25 MED ORDER — PROPOFOL 10 MG/ML IV BOLUS
INTRAVENOUS | Status: AC
  Filled 2020-04-25: qty 20

## 2020-04-25 MED ORDER — HYDROCODONE-ACETAMINOPHEN 5-325 MG PO TABS: 1.0000 | ORAL_TABLET | ORAL | 0 refills | Status: AC | PRN

## 2020-04-25 MED ORDER — ARTIFICIAL TEARS OPHTHALMIC OINT
TOPICAL_OINTMENT | OPHTHALMIC | Status: AC
  Filled 2020-04-25: qty 3.5

## 2020-04-25 MED ORDER — LIDOCAINE HCL 2 % IJ SOLN
INTRAMUSCULAR | Status: DC | PRN
  Administered 2020-04-25: 18 mL

## 2020-04-25 SURGICAL SUPPLY — 30 items
ABLATOR SURESOUND NOVASURE (ABLATOR) ×2 IMPLANT
BIPOLAR CUTTING LOOP 21FR (ELECTRODE)
CANISTER SUCT 3000ML PPV (MISCELLANEOUS) ×4 IMPLANT
CATH ROBINSON RED A/P 16FR (CATHETERS) ×2 IMPLANT
COVER WAND RF STERILE (DRAPES) ×2 IMPLANT
DEVICE MYOSURE LITE (MISCELLANEOUS) IMPLANT
DEVICE MYOSURE REACH (MISCELLANEOUS) IMPLANT
DILATOR CANAL MILEX (MISCELLANEOUS) IMPLANT
ELECT REM PT RETURN 9FT ADLT (ELECTROSURGICAL)
ELECTRODE REM PT RTRN 9FT ADLT (ELECTROSURGICAL) IMPLANT
GAUZE 4X4 16PLY RFD (DISPOSABLE) ×2 IMPLANT
GLOVE ORTHO TXT STRL SZ7.5 (GLOVE) ×2 IMPLANT
GLOVE SURG ENC MOIS LTX SZ8 (GLOVE) ×2 IMPLANT
GOWN STRL REUS W/TWL LRG LVL3 (GOWN DISPOSABLE) ×2 IMPLANT
GOWN STRL REUS W/TWL XL LVL3 (GOWN DISPOSABLE) ×2 IMPLANT
IV NS 1000ML (IV SOLUTION) ×2
IV NS 1000ML BAXH (IV SOLUTION) ×1 IMPLANT
IV NS IRRIG 3000ML ARTHROMATIC (IV SOLUTION) ×2 IMPLANT
KIT PROCEDURE FLUENT (KITS) ×2 IMPLANT
KIT TURNOVER CYSTO (KITS) ×2 IMPLANT
LOOP CUTTING BIPOLAR 21FR (ELECTRODE) IMPLANT
MYOSURE XL FIBROID (MISCELLANEOUS)
PACK VAGINAL MINOR WOMEN LF (CUSTOM PROCEDURE TRAY) ×2 IMPLANT
PAD OB MATERNITY 4.3X12.25 (PERSONAL CARE ITEMS) ×2 IMPLANT
PAD PREP 24X48 CUFFED NSTRL (MISCELLANEOUS) ×2 IMPLANT
SEAL CERVICAL OMNI LOK (ABLATOR) IMPLANT
SEAL ROD LENS SCOPE MYOSURE (ABLATOR) ×2 IMPLANT
SYSTEM TISS REMOVAL MYOSURE XL (MISCELLANEOUS) IMPLANT
TOWEL OR 17X26 10 PK STRL BLUE (TOWEL DISPOSABLE) ×2 IMPLANT
WATER STERILE IRR 500ML POUR (IV SOLUTION) ×2 IMPLANT

## 2020-04-25 NOTE — Anesthesia Procedure Notes (Signed)
Procedure Name: LMA Insertion Date/Time: 04/25/2020 8:02 AM Performed by: Norva Pavlov, CRNA Pre-anesthesia Checklist: Patient identified, Emergency Drugs available, Suction available and Patient being monitored Patient Re-evaluated:Patient Re-evaluated prior to induction Oxygen Delivery Method: Circle system utilized Preoxygenation: Pre-oxygenation with 100% oxygen Induction Type: IV induction Ventilation: Mask ventilation without difficulty LMA: LMA inserted LMA Size: 4.0 Number of attempts: 1 Airway Equipment and Method: Bite block Placement Confirmation: positive ETCO2 Tube secured with: Tape Dental Injury: Teeth and Oropharynx as per pre-operative assessment

## 2020-04-25 NOTE — Interval H&P Note (Signed)
History and Physical Interval Note:  04/25/2020 7:46 AM  Melissa Mckay  has presented today for surgery, with the diagnosis of menorrhagia.  The various methods of treatment have been discussed with the patient and family. After consideration of risks, benefits and other options for treatment, the patient has consented to  Procedure(s): DILATATION & CURETTAGE/HYSTEROSCOPY WITH NOVASURE ABLATION (N/A) DILATATION & CURETTAGE/HYSTEROSCOPY WITH MYOSURE (N/A) as a surgical intervention.  The patient's history has been reviewed, patient examined, no change in status, stable for surgery.  I have reviewed the patient's chart and labs.  Questions were answered to the patient's satisfaction.     Leighton Roach Shonnie Poudrier

## 2020-04-25 NOTE — Discharge Instructions (Signed)
Routine instructions for hysteroscopy/endometrial ablation   Post Anesthesia Home Care Instructions  Activity: Get plenty of rest for the remainder of the day. A responsible adult should stay with you for 24 hours following the procedure.  For the next 24 hours, DO NOT: -Drive a car -Advertising copywriter -Drink alcoholic beverages -Take any medication unless instructed by your physician -Make any legal decisions or sign important papers.  Meals: Start with liquid foods such as gelatin or soup. Progress to regular foods as tolerated. Avoid greasy, spicy, heavy foods. If nausea and/or vomiting occur, drink only clear liquids until the nausea and/or vomiting subsides. Call your physician if vomiting continues.  Special Instructions/Symptoms: Your throat may feel dry or sore from the anesthesia or the breathing tube placed in your throat during surgery. If this causes discomfort, gargle with warm salt water. The discomfort should disappear within 24 hours.  If you had a scopolamine patch placed behind your ear for the management of post- operative nausea and/or vomiting:  1. The medication in the patch is effective for 72 hours, after which it should be removed.  Wrap patch in a tissue and discard in the trash. Wash hands thoroughly with soap and water. 2. You may remove the patch earlier than 72 hours if you experience unpleasant side effects which may include dry mouth, dizziness or visual disturbances. 3. Avoid touching the patch. Wash your hands with soap and water after contact with the patch.   DISCHARGE INSTRUCTIONS: HYSTEROSCOPY / ENDOMETRIAL ABLATION The following instructions have been prepared to help you care for yourself upon your return home.  May Remove Scop patch on or before  May take Ibuprofen after 2:20  May take stool softner while taking narcotic pain medication to prevent constipation.  Drink plenty of water.  Personal hygiene: Marland Kitchen Use sanitary pads for vaginal  drainage, not tampons. . Shower the day after your procedure. . NO tub baths, pools or Jacuzzis for 2-3 weeks. . Wipe front to back after using the bathroom.  Activity and limitations: . Do NOT drive or operate any equipment for 24 hours. The effects of anesthesia are still present and drowsiness may result. . Do NOT rest in bed all day. . Walking is encouraged. . Walk up and down stairs slowly. . You may resume your normal activity in one to two days or as indicated by your physician. Sexual activity: NO intercourse for at least 2 weeks after the procedure, or as indicated by your Doctor.  Diet: Eat a light meal as desired this evening. You may resume your usual diet tomorrow.  Return to Work: You may resume your work activities in one to two days or as indicated by Therapist, sports.  What to expect after your surgery: Expect to have vaginal bleeding/discharge for 2-3 days and spotting for up to 10 days. It is not unusual to have soreness for up to 1-2 weeks. You may have a slight burning sensation when you urinate for the first day. Mild cramps may continue for a couple of days. You may have a regular period in 2-6 weeks.  Call your doctor for any of the following: . Excessive vaginal bleeding or clotting, saturating and changing one pad every hour. . Inability to urinate 6 hours after discharge from hospital. . Pain not relieved by pain medication. . Fever of 100.4 F or greater. . Unusual vaginal discharge or odor.  Return to office _________________Call for an appointment ___________________ Patient's signature: ______________________ Nurse's signature ________________________

## 2020-04-25 NOTE — Anesthesia Postprocedure Evaluation (Signed)
Anesthesia Post Note  Patient: Melissa Mckay  Procedure(s) Performed: DILATATION & CURETTAGE/HYSTEROSCOPY WITH NOVASURE ABLATION (N/A )     Patient location during evaluation: PACU Anesthesia Type: General Level of consciousness: awake and alert Pain management: pain level controlled Vital Signs Assessment: post-procedure vital signs reviewed and stable Respiratory status: spontaneous breathing, nonlabored ventilation, respiratory function stable and patient connected to nasal cannula oxygen Cardiovascular status: blood pressure returned to baseline and stable Postop Assessment: no apparent nausea or vomiting Anesthetic complications: no   No complications documented.  Last Vitals:  Vitals:   04/25/20 0915 04/25/20 0930  BP: 134/78 130/78  Pulse: 63 78  Resp: 16 16  Temp:  36.6 C  SpO2: 97% 98%    Last Pain:  Vitals:   04/25/20 0930  TempSrc:   PainSc: 0-No pain                 Earl Lites P Derold Dorsch

## 2020-04-25 NOTE — Op Note (Signed)
Preoperative diagnosis: Menorrhagia Postoperative diagnosis: Menorrhagia Procedure: Hysteroscopy, NovaSure endometrial ablation Surgeon: Lavina Hamman M.D. Anesthesia: Gen. With an LMA, deep paracervical block Findings: She had a normal, large endometrial cavity with normal endometrial tissue but no unusual lesions. The NovaSure device used to a depth of 6 cm, a width of 4.7 cm and used 155 W for 55 seconds. Fluid deficit to the hysteroscope was 80 cc. Estimated blood loss: Minimal Specimens: None Complications: None  Procedure in detail: The patient was taken to the operating room and placed in the dorsosupine position. General anesthesia was induced and she was placed in mobile stirrups. Perineum and vagina were prepped and draped in usual sterile fashion and bladder drained with a red Robinson catheter. A Graves speculum was inserted into the vagina and the anterior lip of the cervix was grasped with a single-tooth tenaculum. The paracervical block was then performed with a total of 16 cc 1% lidocaine. Uterus then sounded to 9 cm. Cervix was easily dilated to size 6 hegar dilator. The myosure hysteroscope was inserted and good visualization was achieved using lactated Ringer's. The endometrial cavity was normal with no fibroids or significant polyps. The hysteroscope was removed. The cervix was further dilated to a size 7 Hegar dilator measuring the cervix a 3 cm. The NovaSure device was inserted and deployed properly. The CO2 test passed. Endometrial ablation was performed with the above-mentioned settings without difficulty. The device was then allowed to cool for about 30 seconds and was removed. Hysteroscopy was then performed which revealed good global endometrial ablation and still no lesions. Hysteroscope and fluid were then removed. The single-tooth tenaculum was removed from the cervix. Bleeding was controlled with pressure. All instruments were then removed from the vagina. The patient  tolerated the procedure well and was taken to the recovery in stable condition. Counts were correct, she received no antibiotics, she had PAS hose on throughout the procedure.

## 2020-04-25 NOTE — Transfer of Care (Signed)
Immediate Anesthesia Transfer of Care Note  Patient: Melissa Mckay  Procedure(s) Performed: Procedure(s) (LRB): DILATATION & CURETTAGE/HYSTEROSCOPY WITH NOVASURE ABLATION (N/A)  Patient Location: PACU  Anesthesia Type: General  Level of Consciousness: awake, alert  and oriented  Airway & Oxygen Therapy: Patient Spontanous Breathing and Patient connected to nasal cannnula oxygen  Post-op Assessment: Report given to PACU RN and Post -op Vital signs reviewed and stable  Post vital signs: Reviewed and stable  Complications: No apparent anesthesia complications Last Vitals:  Vitals Value Taken Time  BP    Temp    Pulse 74 04/25/20 0841  Resp 15 04/25/20 0841  SpO2 100 % 04/25/20 0841  Vitals shown include unvalidated device data.  Last Pain:  Vitals:   04/25/20 0637  TempSrc: Oral  PainSc: 0-No pain      Patients Stated Pain Goal: 7 (04/25/20 6945)  Complications: No complications documented.

## 2020-04-29 ENCOUNTER — Encounter (HOSPITAL_BASED_OUTPATIENT_CLINIC_OR_DEPARTMENT_OTHER): Payer: Self-pay | Admitting: Obstetrics and Gynecology

## 2020-07-11 ENCOUNTER — Other Ambulatory Visit: Payer: Self-pay | Admitting: Internal Medicine

## 2020-07-11 ENCOUNTER — Telehealth: Payer: Self-pay | Admitting: Internal Medicine

## 2020-07-11 DIAGNOSIS — E063 Autoimmune thyroiditis: Secondary | ICD-10-CM

## 2020-07-11 NOTE — Telephone Encounter (Signed)
Pt is calling in to request a refill on levothyroxine (SYNTHROID) 100 MCG tablet. Pt pharmacy does not have the request. Pt is out of the medication.    [id number] - [place], [place] - [address]

## 2020-07-12 MED ORDER — LEVOTHYROXINE SODIUM 100 MCG PO TABS: 100.0000 ug | ORAL_TABLET | Freq: Every day | ORAL | 0 refills | Status: DC

## 2020-07-12 NOTE — Telephone Encounter (Signed)
Rx sent to preferred pharmacy. Pt notified via Mychart message.

## 2020-09-02 ENCOUNTER — Other Ambulatory Visit: Payer: Self-pay | Admitting: Internal Medicine

## 2020-09-02 DIAGNOSIS — E038 Other specified hypothyroidism: Secondary | ICD-10-CM

## 2020-09-04 ENCOUNTER — Telehealth: Payer: Self-pay | Admitting: Internal Medicine

## 2020-09-04 NOTE — Telephone Encounter (Signed)
Pt called because she has gotten her labs done at her PCP before her appt before and she states she would like to see if she can do that again and have those orders sent over to her PCP? Pt states it is a lot to come here to [place] with the kids and the drive so she was also wondering if it would be ok to set up a MyChart VV to go over her lab results and have her appt?   Pt requests a call when the orders are sent to her PCP so she can go ahead and get them done [phone].

## 2020-09-04 NOTE — Telephone Encounter (Signed)
Ok to schedule a VV and order labs wit PCP?

## 2020-09-05 NOTE — Telephone Encounter (Signed)
Called and left a message for pt to call back to confirm she has a computer or laptop and if so we can switch appt to VV. Labs can be ordered by PCP.

## 2020-09-06 NOTE — Telephone Encounter (Signed)
Patient called to advise that PCP can not do the labs because they are [facility] and PCP won't order labs either.  Patient requesting lab orders to be written by Dr Elvera Lennox and released to the [facility] portal so that she can go her local [facility] for her lab draw.  Patient also requests a [facility] [address] [facility] she has her appointment .  Patient is requesting a call back to advise when labs are released so she knows when she can proceed to [facility] location

## 2020-09-08 ENCOUNTER — Other Ambulatory Visit: Payer: Self-pay | Admitting: Internal Medicine

## 2020-09-08 DIAGNOSIS — E038 Other specified hypothyroidism: Secondary | ICD-10-CM

## 2020-09-09 ENCOUNTER — Other Ambulatory Visit: Payer: Self-pay | Admitting: Internal Medicine

## 2020-09-09 DIAGNOSIS — E538 Deficiency of other specified B group vitamins: Secondary | ICD-10-CM

## 2020-09-09 DIAGNOSIS — E038 Other specified hypothyroidism: Secondary | ICD-10-CM

## 2020-09-09 DIAGNOSIS — R7303 Prediabetes: Secondary | ICD-10-CM

## 2020-09-09 DIAGNOSIS — E559 Vitamin D deficiency, unspecified: Secondary | ICD-10-CM

## 2020-09-09 NOTE — Telephone Encounter (Signed)
OK to send a refills.  I ordered the labs.

## 2020-09-09 NOTE — Telephone Encounter (Signed)
Rx send to preferred pharmacy

## 2020-09-18 ENCOUNTER — Other Ambulatory Visit: Payer: Self-pay | Admitting: Internal Medicine

## 2020-09-19 LAB — T4, FREE: Free T4: 1.48 ng/dL (ref 0.82–1.77)

## 2020-09-19 LAB — HEMOGLOBIN A1C
Est. average glucose Bld gHb Est-mCnc: 105 mg/dL
Hgb A1c MFr Bld: 5.3 % (ref 4.8–5.6)

## 2020-09-19 LAB — THYROID PEROXIDASE ANTIBODY: Thyroperoxidase Ab SerPl-aCnc: 71 IU/mL — ABNORMAL HIGH (ref 0–34)

## 2020-09-19 LAB — VITAMIN B12: Vitamin B-12: 603 pg/mL (ref 232–1245)

## 2020-09-19 LAB — TSH: TSH: 0.857 u[IU]/mL (ref 0.450–4.500)

## 2020-09-19 LAB — VITAMIN D 25 HYDROXY (VIT D DEFICIENCY, FRACTURES): Vit D, 25-Hydroxy: 31.7 ng/mL (ref 30.0–100.0)

## 2020-09-20 ENCOUNTER — Encounter: Payer: Self-pay | Admitting: Internal Medicine

## 2020-09-20 ENCOUNTER — Other Ambulatory Visit: Payer: Self-pay

## 2020-09-20 ENCOUNTER — Telehealth (INDEPENDENT_AMBULATORY_CARE_PROVIDER_SITE_OTHER): Payer: Medicaid Other | Admitting: Internal Medicine

## 2020-09-20 DIAGNOSIS — R7303 Prediabetes: Secondary | ICD-10-CM

## 2020-09-20 DIAGNOSIS — E559 Vitamin D deficiency, unspecified: Secondary | ICD-10-CM

## 2020-09-20 DIAGNOSIS — E538 Deficiency of other specified B group vitamins: Secondary | ICD-10-CM

## 2020-09-20 DIAGNOSIS — E038 Other specified hypothyroidism: Secondary | ICD-10-CM

## 2020-09-20 DIAGNOSIS — E063 Autoimmune thyroiditis: Secondary | ICD-10-CM

## 2020-09-20 DIAGNOSIS — E230 Hypopituitarism: Secondary | ICD-10-CM

## 2020-09-20 MED ORDER — LEVOTHYROXINE SODIUM 100 MCG PO TABS: 100.0000 ug | ORAL_TABLET | Freq: Every day | ORAL | 3 refills | Status: DC

## 2020-09-20 NOTE — Patient Instructions (Signed)
Please continue Levothyroxine 100 mcg a day.  Take the thyroid hormone every day, with water, at least 30 minutes before breakfast, separated by at least 4 hours from: - acid reflux medications - calcium - iron - multivitamins  I would let you know about the results when they return.  Please return in 1 year.

## 2020-09-20 NOTE — Addendum Note (Signed)
Addended by: Carlus Pavlov on: 09/20/2020 05:30 PM   Modules accepted: Orders

## 2020-09-20 NOTE — Progress Notes (Addendum)
Patient ID: Jacquel Redditt, female   DOB: 11/24/1980, 40 y.o.   MRN: [id number]  Virtual Visit via Video Note  Patient location: Home My location: Office  Participants: patient, provider  I connected with Damita Lack on 09/20/20 at 11:29 AM EDT by a video enabled telemedicine application and verified that I am speaking with the correct person using two identifiers.   I discussed the limitations of evaluation and management by telemedicine and the availability of in person appointments. The patient expressed understanding and agreed to proceed.  HPI  Jadwiga Faidley is a 39 y.o.-year-old female, returning for follow-up for Hashimoto's hypothyroidism, prediabetes, vitamin D and B12 insufficiency. Last visit 11 months ago (virtual). PCP: Dr Julie [place] [facility] - [facility]  Interim history: Continues to lose weight - now 209 lbs (lost another 15 lbs in last 5 mo). Has fatigue and  anemia >> got iron infusions >> feeling much better. Will have a uterine ablation soon. She also occasional has fatigue, body aches. Also, continues to have hair loss. She is on Phentermine low dose for last 2 weeks. Also on Topamax. BPs have been high: 140/80-90.  She mentions that she had higher blood pressure reading even before starting phentermine.  Reviewed history: She lost more than 50 pounds since her gastric sleeve surgery on 02/28/2019.  She felt much better and had more energy.  Afterwards, she continued to lose weight, now ~80 lbs total.  Hypothyroidism: - [facility] 2007-[facility] by [facility] >> started on levothyroxine low-dose initially.  She was on levothyroxine 75 mcg daily before her pregnancy and we increased to approximately 96 mcg when she found out that she was pregnant.  Sugars were at goal postpartum on 9 levothyroxine tablets of 75 mcg a week.  We changed to 100 mcg daily, which she continues today.  She takes the levothyroxine: - in am - fasting - at least 30 min from  b'fast - no Ca, Fe - + MVI and PPIs at night - + Calcium 2-3 times a day now moved more than 4 hours after levothyroxine.  - not on Biotin  Reviewed her TFTs: Had labs 2 days ago but results are not available. 11/08/2019: TSH 0.963 09/22/2019: TSH 2.515 Lab Results  Component Value Date   TSH 1.04 10/18/2018   TSH 1.500 04/29/2018   TSH 2.44 03/18/2018   TSH 2.80 07/13/2017   TSH 4.00 01/18/2017   TSH 2.76 07/13/2016   TSH 1.40 01/14/2016   TSH 2.22 09/20/2015   FREET4 1.05 10/18/2018   FREET4 1.23 04/29/2018   FREET4 0.94 07/13/2017   FREET4 0.78 01/18/2017   FREET4 0.83 07/13/2016   FREET4 1.10 01/14/2016   FREET4 0.69 09/20/2015   T3FREE 2.7 09/20/2015  12/30/2017: TSH was 4.040 and my T4 is 1.16 -at OB/GYN's office, after which we increased her levothyroxine dose  Her thyroid antibodies were high, improved on selenium 200 mcg daily.  During the pregnancy, she came off the supplement. Component     Latest Ref Rng & Units 04/29/2018  Thyroperoxidase Ab SerPl-aCnc     <9 IU/mL 14 (H)   Component     Latest Ref Rng & Units 01/14/2016  Thyroperoxidase Ab SerPl-aCnc     <9 IU/mL 143 (H)   Component     Latest Ref Rng & Units 09/20/2015  Thyroglobulin Ab     <2 IU/mL 1  Thyroperoxidase Ab SerPl-aCnc     <9 IU/mL 245 (H)   Pt denies: - feeling nodules  in neck - dysphagia - choking - SOB with lying down However, she does have hoarseness-diagnosed with vocal cord dysfunction.  No FH of thyroid cancer. No h/o radiation tx to head or neck.  No herbal supplements. No Biotin use.   Prediabetes:  She gave birth 08/01/2018 - she has a son. She developed GDM during the pregnancy. She had an abnormal OGTT in 06/2018, with a glucose 214 at 2 hours. She was on Metformin (nausea) and towards the end: insulin x 2 weeks.  Afterwards, sugars normalized. She was on Victoza for 2 mo >> then off.  Reviewed HbA1c levels: 11/08/2019: HbA1c 5.2% Lab Results  Component Value Date    HGBA1C 5.8 (A) 10/18/2018   HGBA1C 5.8 (A) 04/29/2018   HGBA1C 5.4 01/13/2018   HGBA1C 5.5 07/13/2017   HGBA1C 5.4 09/20/2015   CMP (08/14/2020): Glucose 84, BUN/creatinine 10/0.72, GFR >90  vit D deficiency:  Reviewed Levels: 03/01/2020: Vitamin D 32 06/06/2019: Vitamin D 35 Lab Results  Component Value Date   VD25OH 34.09 10/18/2018   VD25OH 23.9 (L) 04/29/2018   VD25OH 28.55 (L) 07/13/2017   VD25OH 31.25 07/13/2016   VD25OH 22.54 (L) 01/14/2016   VD25OH 26.46 (L) 09/20/2015   Prev. On vitamin D 2000 units daily + prenatal vitamins. Now on chewable bariatric MVI. Also on Ca-vitamin D (off and on - qod).  B12 deficiency:  Reviewed levels: 02/12/2020: B12 795 06/22/2019: B12 447 Lab Results  Component Value Date   VITAMINB12 471 10/18/2018   VITAMINB12 365 04/29/2018   VITAMINB12 >1500 (H) 07/13/2017   VITAMINB12 610 07/13/2016   VITAMINB12 282 09/20/2015   She was previously on B12 injections, afterwards on 5000 mcg twice a week.  On bariatric  vitamins now + 5000 mcg once a month.  Partially empty sella:  -Incidentally found during investigation for headaches.  She continues to see neurology for headaches.    Previous pituitary work-up was normal: Component of     Latest Ref Rng & Units 01/18/2017  IGF-I, LC/MS     53 - 331 ng/mL 124  Z-Score (Female)     -2.0 - 2 SD -0.3  C206 ACTH     6 - 50 pg/mL 22  Cortisol, Plasma     ug/dL 85.8  LH     mIU/mL 8.50  FSH     mIU/ML 4.0   She has a daughter born prematurely in 2016 - mother's contracting CMV >> now special-needs child (cerebral palsy).  ROS: + see HPI  Past Medical History:  Diagnosis Date   Asthma    Chlamydia    CMV (cytomegalovirus) antibody positive 2020   COVID 10/2019   all symptoms x 2 weeks all symptoms resolved except loss of smell and taste at times , no hospitliization   Febrile seizure (HCC)    as child once  or twice with high fever none since age 76 or 6   Genital HSV    History  of gestational diabetes    Hypertension    Hypothyroidism    Menorrhagia    Normal pregnancy 09/10/2010   Preeclampsia dx 03-2017   Pregnancy induced hypertension 2012   preE G1   Sleep apnea    cpap set on 8.1   Past Surgical History:  Procedure Laterality Date   BREAST REDUCTION SURGERY     benign tumor removed from left breast   BREAST SURGERY  age 36   reduction   CESAREAN SECTION N/A 07/16/2014   Procedure: CESAREAN SECTION;  Surgeon: Lavina Hamman, MD;  Location: [facility];  Service: Obstetrics;  Laterality: N/A;   CESAREAN SECTION WITH BILATERAL TUBAL LIGATION N/A 08/01/2018   Procedure: CESAREAN SECTION WITH BILATERAL TUBAL LIGATION;  Surgeon: Lavina Hamman, MD;  Location: [facility];  Service: Obstetrics;  Laterality: N/A;  MD request RNFA   CHOLECYSTECTOMY  2017   DILITATION & CURRETTAGE/HYSTROSCOPY WITH NOVASURE ABLATION N/A 04/25/2020   Procedure: DILATATION & CURETTAGE/HYSTEROSCOPY WITH NOVASURE ABLATION;  Surgeon: Lavina Hamman, MD;  Location: [facility];  Service: Gynecology;  Laterality: N/A;   LAPAROSCOPIC GASTRIC SLEEVE RESECTION  01/2019   TYMPANOSTOMY TUBE PLACEMENT  age 72   Social History   Social History   Marital Status: Married    Spouse Name: N/A   Number of Children: 2   Occupational History   [employer], caregiver   Social History Main Topics   Smoking status: Former Smoker   Smokeless tobacco: Not on file   Alcohol Use: No   Drug Use: No   Current Outpatient Medications on File Prior to Visit  Medication Sig Dispense Refill   acetaminophen (TYLENOL) 325 MG tablet Take 650 mg by mouth every 6 (six) hours as needed for mild pain or headache.      albuterol (PROVENTIL HFA;VENTOLIN HFA) 108 (90 BASE) MCG/ACT inhaler Inhale 2 puffs into the lungs every 6 (six) hours as needed for wheezing or shortness of breath. Reported on 09/20/2015     ALPRAZolam (XANAX) 0.25 MG tablet Take 0.25 mg by mouth 2 (two) times daily as needed for anxiety.      fluticasone (FLONASE) 50 MCG/ACT nasal spray Place 1 spray into both nostrils 2 (two) times daily as needed.     HYDROcodone-acetaminophen (NORCO/VICODIN) 5-325 MG tablet Take 1 tablet by mouth every 4 (four) hours as needed for severe pain. 5 tablet 0   ipratropium (ATROVENT) 0.03 % nasal spray Place 2 sprays into both nostrils 2 (two) times daily.     levothyroxine (SYNTHROID) 100 MCG tablet TAKE 1 TABLET BY MOUTH EVERY DAY 30 tablet 0   loratadine (CLARITIN) 10 MG tablet Take 10 mg by mouth daily.     losartan (COZAAR) 100 MG tablet Take 100 mg by mouth at bedtime.     mometasone-formoterol (DULERA) 200-5 MCG/ACT AERO Inhale 2 puffs into the lungs 2 (two) times daily.     montelukast (SINGULAIR) 10 MG tablet Take 10 mg by mouth at bedtime.      omeprazole (PRILOSEC) 20 MG capsule Take 20 mg by mouth at bedtime.     No current facility-administered medications on file prior to visit.   Allergies  Allergen Reactions   Methyldopa Shortness Of Breath and Swelling   Sulfa Antibiotics Hives and Shortness Of Breath   Family History  Problem Relation Age of Onset   Cancer Mother    Diabetes Mother    Anemia Other    COPD Other    Depression Other    Diabetes type II Other    Fibromyalgia Other    Hypertension Other    Thyroid disease Other    Diabetes Father    PE:  There were no vitals taken for this visit. Wt Readings from Last 3 Encounters:  04/25/20 224 lb 9.6 oz (101.9 kg)  10/18/18 274 lb (124.3 kg)  08/01/18 293 lb (132.9 kg)   Constitutional:  in NAD  The physical exam was not performed (virtual visit).  ASSESSMENT: 1. Hashimoto's Hypothyroidism  2. Low Vit B12   3. Vit  D insufficiency  4.  Partially primary empty sella  5.  Prediabetes  PLAN:  1. Patient with longstanding Hashimoto's hypothyroidism, on levothyroxine therapy - latest thyroid labs reviewed with pt. >> normal on 09/22/2019 - she continues on LT4 100 mcg daily - pt feels good on this dose. - we  discussed about taking the thyroid hormone every day, with water, >30 minutes before breakfast, separated by >4 hours from acid reflux medications, calcium, iron, multivitamins. Pt. is taking it correctly. -We discussed that we may need to reduce the dose of levothyroxine as she continues to lose weight.  2. Low Vit B12  -Previously on B12 injections per PCP - she continues bariatric multivitamins including and also takes 5000 mcg tablet once a month  3. Vit D insufficiency -vitamin D level was normal in 01/2020 -She is bariatric multivitamins + every other day calcium-vitamin D supplement -Latest vitamin D level was checked 2 days ago and I am waiting for the results.  4. Empty sella -Primary, partial -We investigated her pituitary hormone status and this was normal -No further investigation is necessary  5.  Prediabetes -Reviewed HbA1c from 2020 and this was only slightly above normal, at 5.3%, on 11/08/2019 -She lost ~80 pounds after her gastric sleeve surgery -She had an HbA1c checked 2 days ago and I am waiting for the results.  Addendum: Labs returned after the visit: 09/18/2020:  HbA1c 5.3% TSH 0.857, free T4 1.48 TPO antibody 71 Vitamin D 31.7 Vitamin B12 603 I refilled her levothyroxine.  Carlus Pavlovristina Twana Wileman, MD PhD [facility]

## 2020-12-30 ENCOUNTER — Other Ambulatory Visit: Payer: Self-pay | Admitting: Internal Medicine

## 2020-12-30 DIAGNOSIS — E038 Other specified hypothyroidism: Secondary | ICD-10-CM

## 2021-09-10 ENCOUNTER — Other Ambulatory Visit: Payer: Self-pay | Admitting: Internal Medicine

## 2021-09-10 DIAGNOSIS — E038 Other specified hypothyroidism: Secondary | ICD-10-CM

## 2021-09-21 ENCOUNTER — Other Ambulatory Visit: Payer: Self-pay | Admitting: Internal Medicine

## 2021-09-21 DIAGNOSIS — E038 Other specified hypothyroidism: Secondary | ICD-10-CM

## 2021-11-08 ENCOUNTER — Other Ambulatory Visit: Payer: Self-pay | Admitting: Internal Medicine

## 2021-11-08 DIAGNOSIS — E063 Autoimmune thyroiditis: Secondary | ICD-10-CM

## 2021-11-10 ENCOUNTER — Other Ambulatory Visit: Payer: Self-pay | Admitting: Internal Medicine

## 2021-11-10 DIAGNOSIS — E038 Other specified hypothyroidism: Secondary | ICD-10-CM

## 2021-11-12 ENCOUNTER — Telehealth: Payer: Self-pay

## 2021-11-12 NOTE — Telephone Encounter (Signed)
Pt called requesting a refill for her Levothyroxine medication. Pt needs appt for further refills.

## 2021-11-13 NOTE — Telephone Encounter (Signed)
Patient has special needs child and can only go to PCP at this time.

## 2021-11-14 ENCOUNTER — Other Ambulatory Visit: Payer: Self-pay | Admitting: Internal Medicine

## 2021-11-14 DIAGNOSIS — E038 Other specified hypothyroidism: Secondary | ICD-10-CM

## 2021-11-14 MED ORDER — LEVOTHYROXINE SODIUM 100 MCG PO TABS: 100.0000 ug | ORAL_TABLET | Freq: Every day | ORAL | 1 refills | Status: AC

## 2021-11-14 NOTE — Telephone Encounter (Signed)
T, I reviewed her most recent thyroid labs from 07/2021.  TSH was normal.  I refilled her levothyroxine for the next 6 months.  Please try to have her do an in person or video visit in 6 months from now, before she needs another refill.

## 2021-11-21 NOTE — Telephone Encounter (Signed)
LMx1 to schedule F/U in [facility]

## 2021-12-03 NOTE — Telephone Encounter (Signed)
LM x1 to schedule 68M.

## 2021-12-26 NOTE — Telephone Encounter (Signed)
Patient does not want to schedule a follow up at this time.  Patient states that because of travel and this office being [facility] and not [facility] she will follow up with her primary care physician.  If she decides to change her mind she will call for an appointment.
